# Patient Record
Sex: Male | Born: 1985
Health system: Southern US, Community
[De-identification: ages and names within clinical notes are randomized; demographics above are authoritative.]

## PROBLEM LIST (undated history)

## (undated) DIAGNOSIS — F419 Anxiety disorder, unspecified: Secondary | ICD-10-CM

## (undated) DIAGNOSIS — R002 Palpitations: Secondary | ICD-10-CM

## (undated) DIAGNOSIS — K219 Gastro-esophageal reflux disease without esophagitis: Secondary | ICD-10-CM

## (undated) DIAGNOSIS — R079 Chest pain, unspecified: Secondary | ICD-10-CM

## (undated) DIAGNOSIS — F410 Panic disorder [episodic paroxysmal anxiety] without agoraphobia: Secondary | ICD-10-CM

## (undated) HISTORY — DX: Palpitations: R00.2

## (undated) HISTORY — DX: Chest pain, unspecified: R07.9

## (undated) HISTORY — PX: KNEE SURGERY: SHX244

---

## 2018-06-03 ENCOUNTER — Encounter (HOSPITAL_BASED_OUTPATIENT_CLINIC_OR_DEPARTMENT_OTHER): Payer: Self-pay

## 2018-06-03 ENCOUNTER — Other Ambulatory Visit: Payer: Self-pay

## 2018-06-03 ENCOUNTER — Emergency Department (HOSPITAL_BASED_OUTPATIENT_CLINIC_OR_DEPARTMENT_OTHER)
Admission: EM | Admit: 2018-06-03 | Discharge: 2018-06-03 | Disposition: A | Discharge status: Home / Self Care | Payer: Self-pay | Attending: Emergency Medicine | Admitting: Emergency Medicine

## 2018-06-03 DIAGNOSIS — F419 Anxiety disorder, unspecified: Secondary | ICD-10-CM | POA: Insufficient documentation

## 2018-06-03 HISTORY — DX: Anxiety disorder, unspecified: F41.9

## 2018-06-03 HISTORY — DX: Gastro-esophageal reflux disease without esophagitis: K21.9

## 2018-06-03 HISTORY — DX: Panic disorder (episodic paroxysmal anxiety): F41.0

## 2018-06-03 MED ORDER — LORAZEPAM 1 MG PO TABS
1.0000 mg | ORAL_TABLET | Freq: Once | ORAL | Status: AC
  Administered 2018-06-03: 1 mg via ORAL
  Filled 2018-06-03: qty 1

## 2018-06-03 NOTE — ED Notes (Signed)
Pt on monitor 
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
## 2018-06-03 NOTE — ED Triage Notes (Signed)
C/o CP x 15 min-EDP at [facility]-pt NAD-resting on stretcher

## 2018-06-22 ENCOUNTER — Emergency Department (HOSPITAL_BASED_OUTPATIENT_CLINIC_OR_DEPARTMENT_OTHER): Payer: Self-pay

## 2018-06-22 ENCOUNTER — Encounter (HOSPITAL_BASED_OUTPATIENT_CLINIC_OR_DEPARTMENT_OTHER): Payer: Self-pay

## 2018-06-22 ENCOUNTER — Emergency Department (HOSPITAL_BASED_OUTPATIENT_CLINIC_OR_DEPARTMENT_OTHER)
Admission: EM | Admit: 2018-06-22 | Discharge: 2018-06-22 | Disposition: A | Discharge status: Home / Self Care | Payer: Self-pay | Attending: Emergency Medicine | Admitting: Emergency Medicine

## 2018-06-22 ENCOUNTER — Other Ambulatory Visit: Payer: Self-pay

## 2018-06-22 DIAGNOSIS — J189 Pneumonia, unspecified organism: Secondary | ICD-10-CM | POA: Insufficient documentation

## 2018-06-22 DIAGNOSIS — R059 Cough, unspecified: Secondary | ICD-10-CM

## 2018-06-22 DIAGNOSIS — R05 Cough: Secondary | ICD-10-CM

## 2018-06-22 DIAGNOSIS — F419 Anxiety disorder, unspecified: Secondary | ICD-10-CM | POA: Insufficient documentation

## 2018-06-22 LAB — GROUP A STREP BY PCR: GROUP A STREP BY PCR: NOT DETECTED

## 2018-06-22 MED ORDER — DEXAMETHASONE 4 MG PO TABS
8.0000 mg | ORAL_TABLET | Freq: Once | ORAL | Status: AC
  Administered 2018-06-22: 8 mg via ORAL
  Filled 2018-06-22: qty 2

## 2018-06-22 MED ORDER — AZITHROMYCIN 250 MG PO TABS: 250.0000 mg | ORAL_TABLET | Freq: Every day | ORAL | 0 refills | Status: DC

## 2018-06-22 NOTE — Discharge Instructions (Addendum)
You were evaluated in the Emergency Department and after careful evaluation, we did not find any emergent condition requiring admission or further testing in the hospital.  Your symptoms today seem to be due to pneumonia.  Please take the antibiotics provided as directed.  Please return to the Emergency Department if you experience any worsening of your condition.  We encourage you to follow up with a primary care provider.  Thank you for allowing us to be a part of your care.

## 2018-06-22 NOTE — ED Provider Notes (Signed)
[facility] Emergency Department Provider Note MRN:  [id number]  Arrival date & time: 06/22/18     Chief Complaint   Sore throat History of Present Illness   Jesse Mckinney is a 32 y.o. year-old male with a history of GERD presenting to the ED with chief complaint of sore throat.  Patient has been experiencing persistent, constant sore throat for 3 weeks.  Today, during an episode of coughing, and the sore throat became much worse, associated with a popping sensation in the throat.  Denies headache or vision change, no shortness of breath, no chest pain.  Denies recent fever, no runny nose, no nasal congestion, no cold-like symptoms.  No sick contacts.  Review of Systems  A complete 10 system review of systems was obtained and all systems are negative except as noted in the HPI and PMH.   Patient's Health History    Past Medical History:  Diagnosis Date  . Anxiety   . GERD (gastroesophageal reflux disease)   . Panic attack     Past Surgical History:  Procedure Laterality Date  . KNEE SURGERY      No family history on file.  Social History   Socioeconomic History  . Marital status: Married    Spouse name: Not on file  . Number of children: Not on file  . Years of education: Not on file  . Highest education level: Not on file  Occupational History  . Not on file  Social Needs  . Financial resource strain: Not on file  . Food insecurity:    Worry: Not on file    Inability: Not on file  . Transportation needs:    Medical: Not on file    Non-medical: Not on file  Tobacco Use  . Smoking status: Never Smoker  . Smokeless tobacco: Never Used  Substance and Sexual Activity  . Alcohol use: Yes    Comment: occ  . Drug use: Never  . Sexual activity: Not on file  Lifestyle  . Physical activity:    Days per week: Not on file    Minutes per session: Not on file  . Stress: Not on file  Relationships  . Social connections:    Talks on phone:  Not on file    Gets together: Not on file    Attends religious service: Not on file    Active member of club or organization: Not on file    Attends meetings of clubs or organizations: Not on file    Relationship status: Not on file  . Intimate partner violence:    Fear of current or ex partner: Not on file    Emotionally abused: Not on file    Physically abused: Not on file    Forced sexual activity: Not on file  Other Topics Concern  . Not on file  Social History Narrative  . Not on file     Physical Exam  Vital Signs and Nursing Notes reviewed Vitals:   06/22/18 1807 06/22/18 2023  BP: (!) 146/82 129/80  Pulse: 95 83  Resp: 18 18  Temp: 99.6 F (37.6 C)   SpO2: 99% 98%    CONSTITUTIONAL: Well-appearing, NAD NEURO:  Alert and oriented x 3, no focal deficits EYES:  eyes equal and reactive ENT/NECK:  no LAD, no JVD; mild erythema to the posterior oropharynx CARDIO: Regular rate, well-perfused, normal S1 and S2 PULM:  CTAB no wheezing or rhonchi GI/GU:  normal bowel sounds, non-distended, non-tender MSK/SPINE:  No gross deformities, no edema SKIN:  no rash, atraumatic PSYCH:  Appropriate speech and behavior  Diagnostic and Interventional Summary    Labs Reviewed  GROUP A STREP BY PCR    DG Neck Soft Tissue  Final Result    DG Chest 2 View  Final Result      Medications  dexamethasone (DECADRON) tablet 8 mg (8 mg Oral Given 06/22/18 2029)     Procedures Critical Care  ED Course and Medical Decision Making  I have reviewed the triage vital signs and the nursing notes.  Pertinent labs & imaging results that were available during my care of the patient were reviewed by me and considered in my medical decision making (see below for details).  3 weeks of sore throat, no lymphadenopathy, no asymmetry to suggest peritonsillar abscess, no trismus or voice change to suggest retropharyngeal abscess.  Will swab for strep throat, given the question of popping  sensation will obtain screening x-rays to exclude pneumothorax or pneumomediastinum.  X-ray of the neck unremarkable.  Chest x-ray reveals likely atypical pneumonia, which makes sense considering patient's 3 weeks of sore throat/cough.  Prescription for Z-Pak, will follow-up with PCP.  After the discussed management above, the patient was determined to be safe for discharge.  The patient was in agreement with this plan and all questions regarding their care were answered.  ED return precautions were discussed and the patient will return to the ED with any significant worsening of condition.  Elmer SowMichael M. Pilar PlateBero, MD [facility] [email or web address]  Final Clinical Impressions(s) / ED Diagnoses     ICD-10-CM   1. Atypical pneumonia J18.9   2. Cough R05 DG Chest 2 View    DG Chest 2 View    ED Discharge Orders         Ordered    azithromycin (ZITHROMAX) 250 MG tablet  Daily     06/22/18 2134             Sabas SousBero, Gustin Zobrist M, MD 06/22/18 2136

## 2018-06-22 NOTE — ED Triage Notes (Signed)
Pt states he had a forceful cough 3-4 weeks and felt a pop in throat that has cont'd to be painful-NAD-steady gait

## 2018-07-20 ENCOUNTER — Emergency Department (HOSPITAL_BASED_OUTPATIENT_CLINIC_OR_DEPARTMENT_OTHER): Payer: Self-pay

## 2018-07-20 ENCOUNTER — Emergency Department (HOSPITAL_BASED_OUTPATIENT_CLINIC_OR_DEPARTMENT_OTHER)
Admission: EM | Admit: 2018-07-20 | Discharge: 2018-07-20 | Disposition: A | Discharge status: Home / Self Care | Payer: Self-pay | Attending: Emergency Medicine | Admitting: Emergency Medicine

## 2018-07-20 ENCOUNTER — Other Ambulatory Visit: Payer: Self-pay

## 2018-07-20 ENCOUNTER — Encounter (HOSPITAL_BASED_OUTPATIENT_CLINIC_OR_DEPARTMENT_OTHER): Payer: Self-pay

## 2018-07-20 DIAGNOSIS — K2100 Gastro-esophageal reflux disease with esophagitis, without bleeding: Secondary | ICD-10-CM

## 2018-07-20 DIAGNOSIS — J40 Bronchitis, not specified as acute or chronic: Secondary | ICD-10-CM | POA: Insufficient documentation

## 2018-07-20 LAB — CBC
HCT: 47.7 % (ref 39.0–52.0)
HEMOGLOBIN: 14.3 g/dL (ref 13.0–17.0)
MCH: 25.9 pg — AB (ref 26.0–34.0)
MCHC: 30 g/dL (ref 30.0–36.0)
MCV: 86.4 fL (ref 80.0–100.0)
NRBC: 0 % (ref 0.0–0.2)
Platelets: 221 10*3/uL (ref 150–400)
RBC: 5.52 MIL/uL (ref 4.22–5.81)
RDW: 13.7 % (ref 11.5–15.5)
WBC: 8.9 10*3/uL (ref 4.0–10.5)

## 2018-07-20 LAB — D-DIMER, QUANTITATIVE (NOT AT ARMC): D DIMER QUANT: 0.28 ug{FEU}/mL (ref 0.00–0.50)

## 2018-07-20 LAB — BASIC METABOLIC PANEL
ANION GAP: 7 (ref 5–15)
BUN: 11 mg/dL (ref 6–20)
CO2: 24 mmol/L (ref 22–32)
Calcium: 9.2 mg/dL (ref 8.9–10.3)
Chloride: 108 mmol/L (ref 98–111)
Creatinine, Ser: 0.89 mg/dL (ref 0.61–1.24)
GFR calc Af Amer: >60 mL/min (ref >60)
GLUCOSE: 118 mg/dL — AB (ref 70–99)
POTASSIUM: 3.1 mmol/L — AB (ref 3.5–5.1)
Sodium: 139 mmol/L (ref 135–145)

## 2018-07-20 LAB — HEPATIC FUNCTION PANEL
ALBUMIN: 4.2 g/dL (ref 3.5–5.0)
ALK PHOS: 70 U/L (ref 38–126)
ALT: 18 U/L (ref 0–44)
AST: 21 U/L (ref 15–41)
Bilirubin, Direct: 0.1 mg/dL (ref 0.0–0.2)
Indirect Bilirubin: 0.8 mg/dL (ref 0.3–0.9)
TOTAL PROTEIN: 7.8 g/dL (ref 6.5–8.1)
Total Bilirubin: 0.9 mg/dL (ref 0.3–1.2)

## 2018-07-20 LAB — LIPASE, BLOOD: LIPASE: 32 U/L (ref 11–51)

## 2018-07-20 LAB — BRAIN NATRIURETIC PEPTIDE: B NATRIURETIC PEPTIDE 5: 28.7 pg/mL (ref 0.0–100.0)

## 2018-07-20 LAB — TROPONIN I

## 2018-07-20 MED ORDER — LIDOCAINE VISCOUS HCL 2 % MT SOLN
15.0000 mL | Freq: Once | OROMUCOSAL | Status: AC
  Administered 2018-07-20: 15 mL via ORAL
  Filled 2018-07-20: qty 15

## 2018-07-20 MED ORDER — ALUM & MAG HYDROXIDE-SIMETH 200-200-20 MG/5ML PO SUSP
30.0000 mL | Freq: Once | ORAL | Status: AC
  Administered 2018-07-20: 30 mL via ORAL
  Filled 2018-07-20: qty 30

## 2018-07-20 MED ORDER — OMEPRAZOLE 20 MG PO CPDR: 20.0000 mg | DELAYED_RELEASE_CAPSULE | Freq: Every day | ORAL | 1 refills | Status: DC

## 2018-07-20 NOTE — ED Notes (Signed)
Called out name is Lobby. Patient did not respond.

## 2018-07-20 NOTE — ED Provider Notes (Signed)
[facility] EMERGENCY DEPARTMENT Provider Note   CSN: [id number] Arrival date & time: 07/20/18  1315     History   Chief Complaint Chief Complaint  Patient presents with  . Shortness of Breath    HPI Jesse Mckinney is a 32 y.o. male.  Patient is a 32 year old obese male with a history of anxiety and GERD presenting today with ongoing issues after being diagnosed with pneumonia approximately 3 weeks ago.  Patient states that his biggest issue is chest discomfort approximately 20 minutes after eating no matter when he eats and also if he eats too soon before going to bed he also has the symptoms of chest tightness.  He states that his stomach will feel very bloated and he will feel nauseated but never vomits or has diarrhea.  He states similar things were going on 3 weeks ago and that is when they told him he had pneumonia.  He completed a course of azithromycin and then took a 10-day course of another antibiotic that he is not sure of.  He is completed the antibiotics and states he will have an occasional cough but he has no production in his cough.  He felt warm this morning but has not had ongoing fevers.  He does not get chest tightness with walking but will occasionally still feel slightly more winded which he attributed to the recent pneumonia.  The symptoms always occur after eating.  He has not had any leg swelling or weight gain.  He actually states that he is lost a significant amount of weight by diet and exercise and continues to do so.  He does not drink alcohol or use illicit drugs.  Today he started having a coughing fit and states it felt like he could not breathe which caused him to go into a panic attack.  He he vomited 2 times and called 911.  When they arrived they noted a temperature of 100.6 and gave him thousand milligrams of Tylenol and an albuterol neb.  Patient states that that improved but he will still occasionally get palpitations.  Currently is taking no  medications.  The history is provided by the patient.  Shortness of Breath  This is a recurrent problem. The average episode lasts 3 weeks. The problem occurs intermittently.Associated symptoms include a fever. It is unknown what precipitated the problem. Treatments tried: abx. The treatment provided no relief. He has had no prior hospitalizations. He has had prior ED visits. He has had no prior ICU admissions. Associated medical issues include pneumonia. Associated medical issues do not include asthma, PE, CAD, heart failure, DVT or recent surgery.    Past Medical History:  Diagnosis Date  . Anxiety   . GERD (gastroesophageal reflux disease)   . Panic attack     There are no active problems to display for this patient.   Past Surgical History:  Procedure Laterality Date  . KNEE SURGERY          Home Medications    Prior to Admission medications   Medication Sig Start Date End Date Taking? Authorizing Provider  azithromycin (ZITHROMAX) 250 MG tablet Take 1 tablet (250 mg total) by mouth daily. Take first 2 tablets together, then 1 every day until finished. 06/22/18   Sabas SousBero, Michael M, MD    Family History No family history on file.  Social History Social History   Tobacco Use  . Smoking status: Never Smoker  . Smokeless tobacco: Never Used  Substance Use Topics  .  Alcohol use: Yes    Comment: occ  . Drug use: Never     Allergies   Iodinated diagnostic agents; Iodine; and Mushroom extract complex   Review of Systems Review of Systems  Constitutional: Positive for fever.  Respiratory: Positive for shortness of breath.   All other systems reviewed and are negative.    Physical Exam Updated Vital Signs BP 124/70   Pulse 74   Temp 99.3 F (37.4 C) (Oral)   Resp (!) 25   Ht 6\' 6"  (1.981 m)   Wt (!) 178.3 kg   SpO2 94%   BMI 45.42 kg/m   Physical Exam Vitals signs and nursing note reviewed.  Constitutional:      General: He is not in acute  distress.    Appearance: He is well-developed. He is obese.  HENT:     Head: Normocephalic and atraumatic.  Eyes:     Conjunctiva/sclera: Conjunctivae normal.     Pupils: Pupils are equal, round, and reactive to light.  Neck:     Musculoskeletal: Normal range of motion and neck supple.  Cardiovascular:     Rate and Rhythm: Normal rate and regular rhythm.     Heart sounds: No murmur.  Pulmonary:     Effort: Pulmonary effort is normal. No respiratory distress.     Breath sounds: Decreased breath sounds present. No wheezing or rales.  Abdominal:     General: There is no distension.     Palpations: Abdomen is soft.     Tenderness: There is no abdominal tenderness. There is no guarding or rebound.  Musculoskeletal: Normal range of motion.        General: No tenderness.     Right lower leg: He exhibits no tenderness. No edema.     Left lower leg: He exhibits no tenderness. No edema.  Skin:    General: Skin is warm and dry.     Capillary Refill: Capillary refill takes less than 2 seconds.     Findings: No erythema or rash.  Neurological:     Mental Status: He is alert and oriented to person, place, and time.  Psychiatric:        Behavior: Behavior normal.      ED Treatments / Results  Labs (all labs ordered are listed, but only abnormal results are displayed) Labs Reviewed  BASIC METABOLIC PANEL - Abnormal; Notable for the following components:      Result Value   Potassium 3.1 (*)    Glucose, Bld 118 (*)    All other components within normal limits  CBC - Abnormal; Notable for the following components:   MCH 25.9 (*)    All other components within normal limits  TROPONIN I  D-DIMER, QUANTITATIVE (NOT AT [facility])  BRAIN NATRIURETIC PEPTIDE  HEPATIC FUNCTION PANEL  LIPASE, BLOOD    EKG EKG Interpretation  Date/Time:  Monday July 20 2018 13:22:54 EST Ventricular Rate:  106 PR Interval:  144 QRS Duration: 94 QT Interval:  330 QTC Calculation: 438 R  Axis:   66 Text Interpretation:  Sinus tachycardia Possible Inferior infarct , age undetermined T wave abnormality, consider lateral ischemia No significant change since last tracing Confirmed by Gwyneth SproutPlunkett, Tennyson Kallen ([id number]) on 07/20/2018 3:12:38 PM   Radiology Dg Chest 2 View  Result Date: 07/20/2018 CLINICAL DATA:  Initial evaluation for acute shortness of breath, cough. EXAM: CHEST - 2 VIEW COMPARISON:  Prior radiograph from 07/10/2018 FINDINGS: Cardiac and mediastinal silhouettes stable in size and contour, and remain within  normal limits. Lungs well inflated bilaterally. Mild diffuse peribronchial thickening. No focal infiltrates, edema, or effusion. No pneumothorax. No acute osseous abnormality. IMPRESSION: Mild diffuse peribronchial thickening, suggesting possible bronchiolitis given provided history of cough and shortness of breath. No focal infiltrates to suggest pneumonia. Electronically Signed   By: Rise Mu M.D.   On: 07/20/2018 13:45    Procedures Procedures (including critical care time)  Medications Ordered in ED Medications  alum & mag hydroxide-simeth (MAALOX/MYLANTA) 200-200-20 MG/5ML suspension 30 mL (30 mLs Oral Given 07/20/18 1525)    And  lidocaine (XYLOCAINE) 2 % viscous mouth solution 15 mL (15 mLs Oral Given 07/20/18 1526)     Initial Impression / Assessment and Plan / ED Course  I have reviewed the triage vital signs and the nursing notes.  Pertinent labs & imaging results that were available during my care of the patient were reviewed by me and considered in my medical decision making (see chart for details).    Patient presenting today with multiple complaints.  Initially seems more like acid reflux with abdominal discomfort and bloating after eating.  This is in the setting of recently having multiple courses of antibiotics for pneumonia.  Patient is also having intermittent palpitations, panic attacks and occasional dyspnea on exertion that has not  really worsened but persists.  He does not smoke cigarettes he does not have productive cough but was noted by EMS to have a temperature of 100.6 today.  On exam patient is well-appearing with normal vital signs.  Low risk Wells with a negative d-dimer.  Chest x-ray showing possible bronchiolitis but no wheezing today.  Patient has no evidence of CHF, renal insufficiency and a normal troponin and EKG.  The chest discomfort he describes is more epigastric and lower sternal.  It is always worse with eating and with laying down if he eats soon before he lays down.  Findings discussed with the patient.  Will have him try a course of PPIs and symptoms were improved with GI cocktail.  Also patient given an inhaler to use when he feels short of breath and stressed the importance of following up with the PCP.   Final Clinical Impressions(s) / ED Diagnoses   Final diagnoses:  Gastroesophageal reflux disease with esophagitis  Bronchitis    ED Discharge Orders         Ordered    omeprazole (PRILOSEC) 20 MG capsule  Daily     07/20/18 1701           Gwyneth Sprout, MD 07/20/18 1807

## 2018-07-20 NOTE — Discharge Instructions (Signed)
Use the inhaler as needed if you have shortness of breath and cough.  Take the medicine for the reflux and try to avoid Alka-Seltzer, aspirin, ibuprofen products and foods that irritate your stomach.  If you develop the palpitations again and they are not going away return so you can get an EKG to ensure you are in a normal rhythm.

## 2018-07-20 NOTE — ED Notes (Signed)
ED Provider at bedside. 

## 2018-07-20 NOTE — ED Triage Notes (Signed)
Pt arrived via [facility] Pt had sudden onset of ShOB and coughing. Pt has a hx of pneumonia x1 month ago which has since resolved. EMS report pt had diminished lung sounds and a temp of 100.6. EMS admin 5mg  of albuterol and 1,000 mg of APAP. EMS did report that during transport pt had a 30 second episode of chest tightness and HR increased to 140 which quickly returned to baseline. Pt states he is feeling anxious.

## 2018-07-20 NOTE — ED Notes (Signed)
Pt had been called multiple times for a room with no response. Pt now returned stated he had to pick up his car and [employer] account plannerphone charger. IV still in place.

## 2018-09-29 ENCOUNTER — Encounter (HOSPITAL_BASED_OUTPATIENT_CLINIC_OR_DEPARTMENT_OTHER): Payer: Self-pay | Admitting: *Deleted

## 2018-09-29 ENCOUNTER — Other Ambulatory Visit: Payer: Self-pay

## 2018-09-29 ENCOUNTER — Emergency Department (HOSPITAL_BASED_OUTPATIENT_CLINIC_OR_DEPARTMENT_OTHER): Payer: Self-pay

## 2018-09-29 ENCOUNTER — Emergency Department (HOSPITAL_BASED_OUTPATIENT_CLINIC_OR_DEPARTMENT_OTHER)
Admission: EM | Admit: 2018-09-29 | Discharge: 2018-09-29 | Disposition: A | Discharge status: Home / Self Care | Payer: Self-pay | Attending: Emergency Medicine | Admitting: Emergency Medicine

## 2018-09-29 DIAGNOSIS — R0602 Shortness of breath: Secondary | ICD-10-CM

## 2018-09-29 DIAGNOSIS — F419 Anxiety disorder, unspecified: Secondary | ICD-10-CM | POA: Insufficient documentation

## 2018-09-29 DIAGNOSIS — J069 Acute upper respiratory infection, unspecified: Secondary | ICD-10-CM | POA: Insufficient documentation

## 2018-09-29 LAB — CBC WITH DIFFERENTIAL/PLATELET
Abs Immature Granulocytes: 0.02 10*3/uL (ref 0.00–0.07)
BASOS PCT: 0 %
Basophils Absolute: 0 10*3/uL (ref 0.0–0.1)
Eosinophils Absolute: 0.1 10*3/uL (ref 0.0–0.5)
Eosinophils Relative: 1 %
HCT: 39.7 % (ref 39.0–52.0)
Hemoglobin: 12 g/dL — ABNORMAL LOW (ref 13.0–17.0)
Immature Granulocytes: 0 %
Lymphocytes Relative: 24 %
Lymphs Abs: 1.9 10*3/uL (ref 0.7–4.0)
MCH: 25.8 pg — ABNORMAL LOW (ref 26.0–34.0)
MCHC: 30.2 g/dL (ref 30.0–36.0)
MCV: 85.4 fL (ref 80.0–100.0)
Monocytes Absolute: 0.8 10*3/uL (ref 0.1–1.0)
Monocytes Relative: 10 %
Neutro Abs: 5.1 10*3/uL (ref 1.7–7.7)
Neutrophils Relative %: 65 %
Platelets: 145 10*3/uL — ABNORMAL LOW (ref 150–400)
RBC: 4.65 MIL/uL (ref 4.22–5.81)
RDW: 14.7 % (ref 11.5–15.5)
Smear Review: NORMAL
WBC: 7.9 10*3/uL (ref 4.0–10.5)
nRBC: 0 % (ref 0.0–0.2)

## 2018-09-29 LAB — COMPREHENSIVE METABOLIC PANEL
ALT: 15 U/L (ref 0–44)
AST: 17 U/L (ref 15–41)
Albumin: 4 g/dL (ref 3.5–5.0)
Alkaline Phosphatase: 68 U/L (ref 38–126)
Anion gap: 6 (ref 5–15)
BILIRUBIN TOTAL: 0.8 mg/dL (ref 0.3–1.2)
BUN: 18 mg/dL (ref 6–20)
CO2: 24 mmol/L (ref 22–32)
Calcium: 8.7 mg/dL — ABNORMAL LOW (ref 8.9–10.3)
Chloride: 107 mmol/L (ref 98–111)
Creatinine, Ser: 0.81 mg/dL (ref 0.61–1.24)
GFR calc Af Amer: >60 mL/min (ref >60)
GFR calc non Af Amer: >60 mL/min (ref >60)
GLUCOSE: 97 mg/dL (ref 70–99)
Potassium: 3.5 mmol/L (ref 3.5–5.1)
Sodium: 137 mmol/L (ref 135–145)
Total Protein: 7.3 g/dL (ref 6.5–8.1)

## 2018-09-29 LAB — TROPONIN I: Troponin I: <0.03 ng/mL (ref <0.03)

## 2018-09-29 MED ORDER — LORAZEPAM 1 MG PO TABS: 1.0000 mg | ORAL_TABLET | Freq: Three times a day (TID) | ORAL | 0 refills | Status: DC | PRN

## 2018-09-29 NOTE — ED Triage Notes (Signed)
Pt reports he got up tonight and he felt very short of breath. No pain. Feels better now, just says he "does not feel right" unable to explain exactly what that means to him. No acute distress. Reports he has had more panic attacks lately, no meds for the same. Pt also is concerned for about 6-7 months he has not been sleeping well.

## 2018-09-29 NOTE — ED Provider Notes (Signed)
[facility] EMERGENCY DEPARTMENT Provider Note   CSN: [id number] Arrival date & time: 09/29/18  [id number]    History   Chief Complaint Chief Complaint  Patient presents with  . Shortness of Breath    HPI Jesse Mckinney is a 33 y.o. male.     The history is provided by the patient and medical records. No language interpreter was used.  Shortness of Breath  Severity:  Moderate Onset quality:  Gradual Duration:  3 days Timing:  Intermittent Progression:  Waxing and waning Chronicity:  Recurrent Context: URI   Relieved by:  Nothing Worsened by:  Nothing Ineffective treatments:  None tried Associated symptoms: cough and sputum production   Associated symptoms: no abdominal pain, no chest pain, no diaphoresis, no fever, no headaches, no hemoptysis, no neck pain, no rash, no sore throat, no vomiting and no wheezing   Risk factors: obesity   Risk factors: no hx of PE/DVT     Past Medical History:  Diagnosis Date  . Anxiety   . GERD (gastroesophageal reflux disease)   . Panic attack     There are no active problems to display for this patient.   Past Surgical History:  Procedure Laterality Date  . KNEE SURGERY          Home Medications    Prior to Admission medications   Medication Sig Start Date End Date Taking? Authorizing Provider  azithromycin (ZITHROMAX) 250 MG tablet Take 1 tablet (250 mg total) by mouth daily. Take first 2 tablets together, then 1 every day until finished. 06/22/18   Sabas SousBero, Michael M, MD  omeprazole (PRILOSEC) 20 MG capsule Take 1 capsule (20 mg total) by mouth daily. 07/20/18   Gwyneth SproutPlunkett, Whitney, MD    Family History No family history on file.  Social History Social History   Tobacco Use  . Smoking status: Never Smoker  . Smokeless tobacco: Never Used  Substance Use Topics  . Alcohol use: Yes    Comment: occ  . Drug use: Never     Allergies   Iodinated diagnostic agents; Iodine; and Mushroom extract  complex   Review of Systems Review of Systems  Constitutional: Positive for fatigue. Negative for chills, diaphoresis and fever.  HENT: Positive for congestion. Negative for sore throat.   Eyes: Negative for visual disturbance.  Respiratory: Positive for cough, sputum production, chest tightness and shortness of breath. Negative for hemoptysis, wheezing and stridor.   Cardiovascular: Negative for chest pain, palpitations and leg swelling.  Gastrointestinal: Negative for abdominal pain, constipation, diarrhea, nausea and vomiting.  Genitourinary: Negative for flank pain.  Musculoskeletal: Negative for back pain, neck pain and neck stiffness.  Skin: Negative for rash and wound.  Neurological: Negative for light-headedness and headaches.  Psychiatric/Behavioral: Negative for agitation.  All other systems reviewed and are negative.    Physical Exam Updated Vital Signs BP 124/71 (BP Location: Right Arm)   Pulse 66   Temp 97.9 F (36.6 C) (Oral)   Resp 18   SpO2 98%   Physical Exam Vitals signs and nursing note reviewed.  Constitutional:      General: He is not in acute distress.    Appearance: He is well-developed. He is obese. He is not ill-appearing, toxic-appearing or diaphoretic.  HENT:     Head: Normocephalic and atraumatic.     Mouth/Throat:     Pharynx: No pharyngeal swelling or oropharyngeal exudate.  Eyes:     Extraocular Movements: Extraocular movements intact.     Conjunctiva/sclera: Conjunctivae  normal.     Pupils: Pupils are equal, round, and reactive to light.  Neck:     Musculoskeletal: Normal range of motion and neck supple.  Cardiovascular:     Rate and Rhythm: Normal rate and regular rhythm.     Heart sounds: No murmur.  Pulmonary:     Effort: Pulmonary effort is normal. No tachypnea or respiratory distress.     Breath sounds: Normal breath sounds. No stridor. No decreased breath sounds, wheezing, rhonchi or rales.  Chest:     Chest wall: No tenderness.   Abdominal:     Palpations: Abdomen is soft.     Tenderness: There is no abdominal tenderness.  Musculoskeletal:     Right lower leg: He exhibits no tenderness. No edema.     Left lower leg: He exhibits no tenderness. No edema.  Skin:    General: Skin is warm and dry.     Capillary Refill: Capillary refill takes less than 2 seconds.     Findings: No rash.  Neurological:     General: No focal deficit present.     Mental Status: He is alert.  Psychiatric:        Mood and Affect: Mood normal.      ED Treatments / Results  Labs (all labs ordered are listed, but only abnormal results are displayed) Labs Reviewed  CBC WITH DIFFERENTIAL/PLATELET - Abnormal; Notable for the following components:      Result Value   Hemoglobin 12.0 (*)    MCH 25.8 (*)    Platelets 145 (*)    All other components within normal limits  COMPREHENSIVE METABOLIC PANEL - Abnormal; Notable for the following components:   Calcium 8.7 (*)    All other components within normal limits  TROPONIN I    EKG EKG Interpretation  Date/Time:  Tuesday September 29 2018 06:40:25 EST Ventricular Rate:  70 PR Interval:    QRS Duration: 85 QT Interval:  373 QTC Calculation: 403 R Axis:   74 Text Interpretation:  Sinus rhythm Low voltage, precordial leads flipped t wave in inferior leads resolved Otherwise no significant change Confirmed by Melene Plan [phone]) on 09/29/2018 6:46:08 AM Also confirmed by Melene Plan [phone]), editor Barbette Hair [phone])  on 09/29/2018 7:14:02 AM   Radiology Dg Chest 2 View  Result Date: 09/29/2018 CLINICAL DATA:  Initial evaluation for acute shortness of breath. EXAM: CHEST - 2 VIEW COMPARISON:  Prior radiograph from 07/20/2018. FINDINGS: The cardiac and mediastinal silhouettes are stable in size and contour, and remain within normal limits. The lungs are normally inflated. Mild diffuse peribronchial thickening. No airspace consolidation, pleural effusion, or pulmonary edema is identified.  There is no pneumothorax. No acute osseous abnormality identified. IMPRESSION: 1. Mild diffuse peribronchial thickening, nonspecific, but can be seen in setting of reactive airways disease and/or bronchiolitis. No consolidative opacity to suggest pneumonia. 2. No other active cardiopulmonary disease. Electronically Signed   By: Rise Mu M.D.   On: 09/29/2018 07:03    Procedures Procedures (including critical care time)  Medications Ordered in ED Medications - No data to display   Initial Impression / Assessment and Plan / ED Course  I have reviewed the triage vital signs and the nursing notes.  Pertinent labs & imaging results that were available during my care of the patient were reviewed by me and considered in my medical decision making (see chart for details).        Amado Andal is a 33 y.o. male with a  past medical history significant for anxiety, panic attacks, and GERD who presents with several days of congestion, cough, chills, shortness of breath, mild chest tightness.  Patient reports that this morning he woke up and was having difficulty breathing prompting him to call 911 for evaluation.  Patient thinks it may be related to anxiety and stress however he does think that he has had upper respiratory symptoms.  He reports that his wife had recent flulike illness and has had the congestion and slightly productive cough for the last several days.  He denies any chest pain at this time.  Denies any palpitations.  No leg pain or leg swelling.  No history of DVT or PE.  Patient reports no abdominal pain, nausea, vomiting, diaphoresis, urinary symptoms or GI symptoms.  No recent trauma.  On exam, patient's lungs are clear.  No wheezing rhonchi or crackles.  No tenderness in the chest or abdomen.  Normal pulses in extremities.  Minimal edema in the legs.  Normal strength in the legs.  Patient resting comfortably and he has reassuring vital signs with a normal heart rate, oxygen  saturations were 98 and 100% on room air, and he is not tachypneic.  Clinical aspect patient has a combination of viral URI worsening his breathing and cough as well as anxiety and panic attack.  EKG shows no STEMI today.  No significant arrhythmia.  Patient will have a single troponin as her symptoms of been ongoing for the last few days with chest tightness.  Low suspicion for cardiac etiology.  Patient will screen laboratory testing and chest x-ray to look for pneumonia.  If x-ray does not show pneumonia, and labs are reassuring and patient remains on room air, patient will likely be stable for discharge home with conservative recommendations for viral URI causing shortness of breath as well as recommendations to follow-up with PCP for further management of anxiety and stress.  Will consider prescription for short course of Ativan to help with panic attacks.  8:42 AM Laboratory testing was overall reassuring.  Chest x-ray showed no acute pneumonia but did show some symptoms of reactive airway disease or bronchiolitis.  Patient reassessed and still had no wheezing.  And off albuterol abuse beneficial at this time however patient will be treated with prescription for Ativan to help with his anxiety at home.  Patient will follow-up with PCP for outpatient management.  Patient understood return precautions and follow-up instructions.  Patient discharged in good condition with improved symptoms and reassuring vital signs.    Final Clinical Impressions(s) / ED Diagnoses   Final diagnoses:  Upper respiratory tract infection, unspecified type  SOB (shortness of breath)  Anxiety    ED Discharge Orders         Ordered    LORazepam (ATIVAN) 1 MG tablet  Every 8 hours PRN     09/29/18 0840          Clinical Impression: 1. Upper respiratory tract infection, unspecified type   2. SOB (shortness of breath)   3. Anxiety     Disposition: Discharge  Condition: Good  I have discussed the  results, Dx and Tx plan with the pt(& family if present). He/she/they expressed understanding and agree(s) with the plan. Discharge instructions discussed at great length. Strict return precautions discussed and pt &/or family have verbalized understanding of the instructions. No further questions at time of discharge.    New Prescriptions   LORAZEPAM (ATIVAN) 1 MG TABLET    Take 1 tablet (1  mg total) by mouth every 8 (eight) hours as needed for anxiety.    Follow Up: [facility] [address] [place] [address] [phone] Schedule an appointment as soon as possible for a visit    [facility] [address] [id number] [address] [employer] [place] [address] [phone]       Tegeler, Canary Brim, MD 09/29/18 [phone]

## 2018-09-29 NOTE — ED Notes (Signed)
ED Provider at bedside. 

## 2018-09-29 NOTE — ED Notes (Addendum)
ED Provider at bedside. Dr. Tegeler 

## 2018-09-29 NOTE — ED Triage Notes (Signed)
Pt arrives via EMS from home, with c/o sob tonight. En route vitals 136/80, hr 70, hx of anxiety, gerd. 12 lead WNL, pt had recent anxiety attack.

## 2018-09-29 NOTE — Discharge Instructions (Signed)
Your history and exam today are consistent with a viral upper respiratory infection causing your congestion and cough and shortness of breath symptoms.  I suspect that your anxiety and panic exacerbated the symptoms overnight.  Without wheezing, we did not feel that albuterol would be as beneficial at this time however we do think that treating her anxiety would help.  We also feel you need to follow-up with your primary doctor and as you discussed, may need outpatient work-up for sleep apnea.  Please follow-up with your primary care physician and use the anxiety medicine if you are having a panic attack.  Please follow-up with them for further ongoing management of your symptoms.  If any symptoms change or worsen, please return to the nearest emergency department.

## 2018-09-30 ENCOUNTER — Ambulatory Visit: Payer: Self-pay | Admitting: Family Medicine

## 2018-10-02 ENCOUNTER — Encounter (HOSPITAL_BASED_OUTPATIENT_CLINIC_OR_DEPARTMENT_OTHER): Payer: Self-pay | Admitting: Emergency Medicine

## 2018-10-02 ENCOUNTER — Emergency Department (HOSPITAL_BASED_OUTPATIENT_CLINIC_OR_DEPARTMENT_OTHER)
Admission: EM | Admit: 2018-10-02 | Discharge: 2018-10-02 | Disposition: A | Discharge status: Home / Self Care | Payer: Self-pay | Attending: Emergency Medicine | Admitting: Emergency Medicine

## 2018-10-02 ENCOUNTER — Other Ambulatory Visit: Payer: Self-pay

## 2018-10-02 DIAGNOSIS — Z79899 Other long term (current) drug therapy: Secondary | ICD-10-CM | POA: Insufficient documentation

## 2018-10-02 DIAGNOSIS — F410 Panic disorder [episodic paroxysmal anxiety] without agoraphobia: Secondary | ICD-10-CM | POA: Insufficient documentation

## 2018-10-02 MED ORDER — LORAZEPAM 1 MG PO TABS: 2.0000 mg | ORAL_TABLET | Freq: Once | ORAL | Status: DC

## 2018-10-02 MED ORDER — HYDROXYZINE HCL 25 MG PO TABS: 25.0000 mg | ORAL_TABLET | Freq: Four times a day (QID) | ORAL | 0 refills | Status: DC | PRN

## 2018-10-02 MED ORDER — LORAZEPAM 1 MG PO TABS
1.0000 mg | ORAL_TABLET | Freq: Once | ORAL | Status: AC
  Administered 2018-10-02: 1 mg via ORAL
  Filled 2018-10-02: qty 1

## 2018-10-02 NOTE — ED Provider Notes (Signed)
[facility] EMERGENCY DEPARTMENT Provider Note   CSN: [id number] Arrival date & time: 10/02/18  2209    History   Chief Complaint Chief Complaint  Patient presents with  . Anxiety    HPI Jesse Mckinney is a 33 y.o. male with past medical history of anxiety, GERD, presenting to the emergency department with complaint of panic attack that occurred while at work prior to arrival.  Patient states he began feeling a "weakness" in his chest and felt like he may be having a panic attack coming on.  He states he began to walk to his car, however began feeling worse.  He began feeling like his heart was racing a little bit shortness of breath.  He then called EMS.  No chest pain.  No nausea or vomiting.  No radiation of symptoms.  No interventions provided by EMS, however symptoms are nearly resolved.  He was provided with a prescription of Ativan from his last ED visit 3 days ago, however has not yet filled it.  He states he had a PCP appointment scheduled for yesterday, however was called into work and was unable to make it to the appointment.     The history is provided by the patient.    Past Medical History:  Diagnosis Date  . Anxiety   . GERD (gastroesophageal reflux disease)   . Panic attack     There are no active problems to display for this patient.   Past Surgical History:  Procedure Laterality Date  . KNEE SURGERY          Home Medications    Prior to Admission medications   Medication Sig Start Date End Date Taking? Authorizing Provider  azithromycin (ZITHROMAX) 250 MG tablet Take 1 tablet (250 mg total) by mouth daily. Take first 2 tablets together, then 1 every day until finished. 06/22/18   Sabas Sous, MD  LORazepam (ATIVAN) 1 MG tablet Take 1 tablet (1 mg total) by mouth every 8 (eight) hours as needed for anxiety. 09/29/18   Tegeler, Canary Brim, MD  omeprazole (PRILOSEC) 20 MG capsule Take 1 capsule (20 mg total) by mouth daily. 07/20/18    Gwyneth Sprout, MD    Family History No family history on file.  Social History Social History   Tobacco Use  . Smoking status: Never Smoker  . Smokeless tobacco: Never Used  Substance Use Topics  . Alcohol use: Yes    Comment: occ  . Drug use: Never     Allergies   Iodinated diagnostic agents; Iodine; and Mushroom extract complex   Review of Systems Review of Systems  Respiratory: Positive for chest tightness and shortness of breath.   Psychiatric/Behavioral: Negative for dysphoric mood and suicidal ideas. The patient is nervous/anxious.   All other systems reviewed and are negative.    Physical Exam Updated Vital Signs BP 134/80   Pulse 79   Temp 98.6 F (37 C) (Oral)   Resp 16   Ht 6\' 6"  (1.981 m)   Wt (!) 199.1 kg   SpO2 94%   BMI 50.73 kg/m   Physical Exam Vitals signs and nursing note reviewed.  Constitutional:      Appearance: He is well-developed.     Comments: Morbidly obese.  Appears slightly anxious, however is not in acute distress.  HENT:     Head: Normocephalic and atraumatic.  Eyes:     Extraocular Movements: Extraocular movements intact.     Conjunctiva/sclera: Conjunctivae normal.  Pupils: Pupils are equal, round, and reactive to light.  Cardiovascular:     Rate and Rhythm: Normal rate and regular rhythm.  Pulmonary:     Effort: Pulmonary effort is normal. No respiratory distress.     Breath sounds: Normal breath sounds.  Abdominal:     General: Bowel sounds are normal.     Palpations: Abdomen is soft.     Tenderness: There is no abdominal tenderness. There is no guarding.  Skin:    General: Skin is warm.  Neurological:     Mental Status: He is alert.  Psychiatric:        Behavior: Behavior normal.        Thought Content: Thought content does not include homicidal or suicidal ideation.     Comments: Patient is cooperative.  May be slightly anxious, however not in distress.      ED Treatments / Results  Labs (all labs  ordered are listed, but only abnormal results are displayed) Labs Reviewed - No data to display  EKG None  Radiology No results found.  Procedures Procedures (including critical care time)  Medications Ordered in ED Medications  LORazepam (ATIVAN) tablet 1 mg (1 mg Oral Given 10/02/18 2233)     Initial Impression / Assessment and Plan / ED Course  I have reviewed the triage vital signs and the nursing notes.  Pertinent labs & imaging results that were available during my care of the patient were reviewed by me and considered in my medical decision making (see chart for details).        Patient with history anxiety, presenting via EMS after panic attack that occurred while at work today.  Patient symptoms nearly resolved upon evaluation.  She states he had the fleeting feeling of anxiety and was unable to stand top of the symptoms before it took over.  He was given a prescription for Ativan by previous ED provider 3 days ago, however patient has not filled this prescription yet.  He has PCP that he can follow-up with.  States in the past Wellbutrin has helped his symptoms and feels like maybe he needs to return to this medication.  Treated in the ED with dose of Ativan.  Patient nearly at his baseline.  Will discharge return precautions.  Patient's wife is coming to pick him up.  Pt is agreeable to plan.  Discussed results, findings, treatment and follow up. Patient advised of return precautions. Patient verbalized understanding and agreed with plan.   Final Clinical Impressions(s) / ED Diagnoses   Final diagnoses:  Anxiety attack    ED Discharge Orders    None       Waverly Chavarria, [place] N, PA-C 10/02/18 2244    Gwyneth Sprout, MD 10/05/18 [phone]

## 2018-10-02 NOTE — Discharge Instructions (Addendum)
Fill the prescription prescribed to you during last ED visit.  You can take his medication as prescribed for anxiety.  Schedule an appointment with your primary care provider for close follow-up on your recent ED visits. Return to the emergency department if you began having thoughts or feelings of harming herself or ending your life.

## 2018-10-02 NOTE — ED Triage Notes (Signed)
Pt states he had a panic attack while at work tonight. Pt has prescription for ativan and plans on picking it up when he gets paid.

## 2018-11-24 ENCOUNTER — Other Ambulatory Visit: Payer: Self-pay

## 2018-11-24 ENCOUNTER — Encounter (HOSPITAL_COMMUNITY): Payer: Self-pay

## 2018-11-24 ENCOUNTER — Ambulatory Visit (INDEPENDENT_AMBULATORY_CARE_PROVIDER_SITE_OTHER): Payer: Self-pay

## 2018-11-24 ENCOUNTER — Ambulatory Visit (HOSPITAL_COMMUNITY)
Admission: EM | Admit: 2018-11-24 | Discharge: 2018-11-24 | Disposition: A | Discharge status: Home / Self Care | Payer: Self-pay | Attending: Family Medicine | Admitting: Family Medicine

## 2018-11-24 DIAGNOSIS — R0602 Shortness of breath: Secondary | ICD-10-CM

## 2018-11-24 DIAGNOSIS — F419 Anxiety disorder, unspecified: Secondary | ICD-10-CM

## 2018-11-24 MED ORDER — BUSPIRONE HCL 10 MG PO TABS: 10.0000 mg | ORAL_TABLET | Freq: Three times a day (TID) | ORAL | 0 refills | Status: DC

## 2018-11-24 MED ORDER — HYDROXYZINE HCL 25 MG PO TABS: 25.0000 mg | ORAL_TABLET | Freq: Four times a day (QID) | ORAL | 0 refills | Status: DC | PRN

## 2018-11-24 NOTE — ED Triage Notes (Addendum)
C/o chest pain and sob that began last night but states that has not resolved, pt states he thought it was just anxiety but symptoms usually resolve if so. Pt reports that he ran out of his medication 2 days ago

## 2018-11-24 NOTE — Discharge Instructions (Addendum)
Your physical examination is normal.  Lungs are clear and heart sounds normal. Your chest x-ray is normal. I believe your symptoms are from anxiety I have refilled your BuSpar for 30 days I have also refilled the hydroxyzine as needed for panic It is important that you establish with a new mental health provider to get refills

## 2018-11-24 NOTE — ED Provider Notes (Signed)
[facility]    CSN: [id number] Arrival date & time: 11/24/18  0901     History   Chief Complaint Chief Complaint  Patient presents with  . Chest Pain    HPI Jesse Mckinney is a 33 y.o. male.   HPI   Patient has a history of chronic anxiety with panic attacks.  He is not currently under physicians care for this.  He has been on multiple medications for this and has seen psychiatry in the past.  He has been most successful with BuSpar taken daily with occasional Ativan.  He states in spite of this when he went to the emergency room on 3/6/ 2020 they give a medicine that he thinks worked better than Ativan.  This medicine was hydroxyzine. Patient has been out of his medicines for 3 days. Patient states last night he developed some chest pain shortness of breath.  He recognizes his panic.  He had rapid heartbeats.  He said he did some deep breathing and was able to get to sleep.  When he woke up this morning he was still feeling a little short of breath. He has had no fever chills.  No cough.  No history of asthma or lung disease.  He had pneumonia in the past but this just feels different.  He no longer has chest pain or pressure.  He is here because of his wife's concern.  He is requesting a chest x-ray.  Past Medical History:  Diagnosis Date  . Anxiety   . GERD (gastroesophageal reflux disease)   . Panic attack     There are no active problems to display for this patient.   Past Surgical History:  Procedure Laterality Date  . KNEE SURGERY         Home Medications    Prior to Admission medications   Medication Sig Start Date End Date Taking? Authorizing Provider  LORazepam (ATIVAN) 1 MG tablet Take 1 tablet (1 mg total) by mouth every 8 (eight) hours as needed for anxiety. 09/29/18  Yes Tegeler, Canary Brim, MD  busPIRone (BUSPAR) 10 MG tablet Take 1 tablet (10 mg total) by mouth 3 (three) times daily. 11/24/18   Eustace Moore, MD  hydrOXYzine  (ATARAX/VISTARIL) 25 MG tablet Take 1 tablet (25 mg total) by mouth every 6 (six) hours as needed for anxiety. 11/24/18   Eustace Moore, MD    Family History Family History  Problem Relation Age of Onset  . Healthy Mother   . Healthy Father     Social History Social History   Tobacco Use  . Smoking status: Never Smoker  . Smokeless tobacco: Never Used  Substance Use Topics  . Alcohol use: Yes    Comment: occ  . Drug use: Never     Allergies   Iodinated diagnostic agents; Iodine; and Mushroom extract complex   Review of Systems Review of Systems  Constitutional: Negative for chills and fever.  HENT: Negative for ear pain and sore throat.   Eyes: Negative for pain and visual disturbance.  Respiratory: Positive for chest tightness and shortness of breath. Negative for cough.        Shortness of breath and tightness in chest with panic attack  Cardiovascular: Positive for palpitations. Negative for chest pain.       Rapid heartbeat with panic attack  Gastrointestinal: Negative for abdominal pain and vomiting.  Genitourinary: Negative for dysuria and hematuria.  Musculoskeletal: Negative for arthralgias and back pain.  Skin: Negative for  color change and rash.  Neurological: Negative for seizures and syncope.  Psychiatric/Behavioral: The patient is nervous/anxious.   All other systems reviewed and are negative.    Physical Exam Triage Vital Signs ED Triage Vitals  Enc Vitals Group     BP 11/24/18 0915 125/79     Pulse Rate 11/24/18 0912 79     Resp 11/24/18 0912 18     Temp 11/24/18 0912 98.3 F (36.8 C)     Temp src --      SpO2 11/24/18 0912 96 %     Weight --      Height --      Head Circumference --      Peak Flow --      Pain Score 11/24/18 0915 4     Pain Loc --      Pain Edu? --      Excl. in GC? --    No data found.  Updated Vital Signs BP 125/79   Pulse 79   Temp 98.3 F (36.8 C)   Resp 18   SpO2 96%   Visual Acuity Right Eye  Distance:   Left Eye Distance:   Bilateral Distance:    Right Eye Near:   Left Eye Near:    Bilateral Near:     Physical Exam Constitutional:      General: He is not in acute distress.    Appearance: He is well-developed. He is obese.     Comments: Super obese  HENT:     Head: Normocephalic and atraumatic.  Eyes:     Conjunctiva/sclera: Conjunctivae normal.     Pupils: Pupils are equal, round, and reactive to light.  Neck:     Musculoskeletal: Normal range of motion.  Cardiovascular:     Rate and Rhythm: Normal rate and regular rhythm.     Heart sounds: Normal heart sounds.  Pulmonary:     Effort: Pulmonary effort is normal. No respiratory distress.     Breath sounds: Normal breath sounds. No wheezing, rhonchi or rales.  Abdominal:     General: There is no distension.     Palpations: Abdomen is soft.  Musculoskeletal: Normal range of motion.  Skin:    General: Skin is warm and dry.  Neurological:     General: No focal deficit present.     Mental Status: He is alert.  Psychiatric:        Mood and Affect: Mood normal.        Behavior: Behavior normal.      UC Treatments / Results  Labs (all labs ordered are listed, but only abnormal results are displayed) Labs Reviewed - No data to display  EKG None  Radiology Dg Chest 2 View  Result Date: 11/24/2018 CLINICAL DATA:  Chest pain and shortness of breath. EXAM: CHEST - 2 VIEW COMPARISON:  Two-view chest x-ray 09/29/2018 FINDINGS: Heart size is normal. Mild peribronchial thickening is again seen. There is no significant interval change. No focal airspace disease is present. There is no edema or effusion. IMPRESSION: 1. No acute cardiopulmonary disease or significant interval change. Electronically Signed   By: Marin Robertshristopher  Mattern M.D.   On: 11/24/2018 09:56    Procedures Procedures (including critical care time)  Medications Ordered in UC Medications - No data to display  Initial Impression / Assessment and  Plan / UC Course  I have reviewed the triage vital signs and the nursing notes.  Pertinent labs & imaging results that were available during my  care of the patient were reviewed by me and considered in my medical decision making (see chart for details).    I attempted to reassure the patient that I did not feel like he needed another chest x-ray.  He has had 2 of these in the last 4 months.  His lungs are clear.  Patient states that "if I go out to the car without having a chest x-ray my wife will send me right back in".   Final Clinical Impressions(s) / UC Diagnoses   Final diagnoses:  Anxiety  SOB (shortness of breath)     Discharge Instructions     Your physical examination is normal.  Lungs are clear and heart sounds normal. Your chest x-ray is normal. I believe your symptoms are from anxiety I have refilled your BuSpar for 30 days I have also refilled the hydroxyzine as needed for panic It is important that you establish with a new mental health provider to get refills    ED Prescriptions    Medication Sig Dispense Auth. Provider   busPIRone (BUSPAR) 10 MG tablet Take 1 tablet (10 mg total) by mouth 3 (three) times daily. 90 tablet Eustace Moore, MD   hydrOXYzine (ATARAX/VISTARIL) 25 MG tablet  (Status: Discontinued) Take 1 tablet (25 mg total) by mouth every 6 (six) hours as needed for anxiety. 12 tablet Eustace Moore, MD   hydrOXYzine (ATARAX/VISTARIL) 25 MG tablet Take 1 tablet (25 mg total) by mouth every 6 (six) hours as needed for anxiety. 12 tablet Eustace Moore, MD     Controlled Substance Prescriptions [place] Controlled Substance Registry consulted? Not Applicable   Eustace Moore, MD 11/24/18 1012

## 2019-03-21 ENCOUNTER — Encounter (HOSPITAL_COMMUNITY): Payer: Self-pay | Admitting: Physician Assistant

## 2019-03-21 ENCOUNTER — Ambulatory Visit (HOSPITAL_COMMUNITY)
Admission: EM | Admit: 2019-03-21 | Discharge: 2019-03-21 | Disposition: A | Discharge status: Home / Self Care | Payer: BLUE CROSS/BLUE SHIELD | Attending: Family Medicine | Admitting: Family Medicine

## 2019-03-21 ENCOUNTER — Telehealth (HOSPITAL_COMMUNITY): Payer: Self-pay | Admitting: Emergency Medicine

## 2019-03-21 ENCOUNTER — Other Ambulatory Visit: Payer: Self-pay

## 2019-03-21 DIAGNOSIS — K047 Periapical abscess without sinus: Secondary | ICD-10-CM

## 2019-03-21 MED ORDER — CHLORHEXIDINE GLUCONATE 0.12 % MT SOLN: 15.0000 mL | Freq: Two times a day (BID) | OROMUCOSAL | 0 refills | Status: DC

## 2019-03-21 MED ORDER — CLINDAMYCIN HCL 150 MG PO CAPS: 450.0000 mg | ORAL_CAPSULE | Freq: Three times a day (TID) | ORAL | 0 refills | Status: AC

## 2019-03-21 MED ORDER — CLINDAMYCIN HCL 150 MG PO CAPS: 450.0000 mg | ORAL_CAPSULE | Freq: Three times a day (TID) | ORAL | 0 refills | Status: DC

## 2019-03-21 NOTE — ED Notes (Signed)
Patient appeared in room 5 .  Reports being in bathroom.  Provided instructions and rerouted medications to preferred pharmacy.

## 2019-03-21 NOTE — ED Provider Notes (Signed)
[facility]    CSN: [id number] Arrival date & time: 03/21/19  1032      History   Chief Complaint No chief complaint on file.   HPI Jesse Mckinney is a 33 y.o. male.   33 year old male comes in for evaluation of right dental/gum pain.  About 5 days ago, he had a tooth extracted.  He was put on penicillin, and was given Tylenol #3 for the pain.  Patient has had good relief until 2 days ago, noticed that the suture fell out.  He has since then have increased pain, swelling to the gum, and foul smell.  He denies any fever, chills, body aches.  Denies swelling of the throat, tripoding, drooling, trismus. He has 2 days of penicillin left.      Past Medical History:  Diagnosis Date  . Anxiety   . GERD (gastroesophageal reflux disease)   . Panic attack     There are no active problems to display for this patient.   Past Surgical History:  Procedure Laterality Date  . KNEE SURGERY         Home Medications    Prior to Admission medications   Medication Sig Start Date End Date Taking? Authorizing Provider  busPIRone (BUSPAR) 10 MG tablet Take 1 tablet (10 mg total) by mouth 3 (three) times daily. 11/24/18   Eustace MooreNelson, Yvonne Sue, MD  chlorhexidine (PERIDEX) 0.12 % solution Use as directed 15 mLs in the mouth or throat 2 (two) times daily. 03/21/19   Cathie HoopsYu, Amy V, PA-C  clindamycin (CLEOCIN) 150 MG capsule Take 3 capsules (450 mg total) by mouth 3 (three) times daily for 7 days. 03/21/19 03/28/19  Belinda FisherYu, Amy V, PA-C    Family History Family History  Problem Relation Age of Onset  . Healthy Mother   . Healthy Father     Social History Social History   Tobacco Use  . Smoking status: Never Smoker  . Smokeless tobacco: Never Used  Substance Use Topics  . Alcohol use: Yes    Comment: occ  . Drug use: Never     Allergies   Iodinated diagnostic agents, Iodine, and Mushroom extract complex   Review of Systems Review of Systems  Reason unable to perform ROS: See  HPI as above.     Physical Exam Triage Vital Signs ED Triage Vitals  Enc Vitals Group     BP      Pulse      Resp      Temp      Temp src      SpO2      Weight      Height      Head Circumference      Peak Flow      Pain Score      Pain Loc      Pain Edu?      Excl. in GC?    No data found.  Updated Vital Signs BP 130/81   Pulse 67   Temp 98.4 F (36.9 C) (Oral)   SpO2 98%   Physical Exam Constitutional:      General: He is not in acute distress.    Appearance: He is well-developed. He is not ill-appearing, toxic-appearing or diaphoretic.  HENT:     Head: Normocephalic and atraumatic.     Jaw: No trismus.     Mouth/Throat:     Mouth: Mucous membranes are moist.     Pharynx: Oropharynx is clear. Uvula midline. No uvula  swelling.     Tonsils: No tonsillar exudate.      Comments: Tenderness to palpation right upper gumline with mild swelling. No fluctuance felt.   Floor of mouth soft to palpation. No facial swelling.  Eyes:     Conjunctiva/sclera: Conjunctivae normal.     Pupils: Pupils are equal, round, and reactive to light.  Neck:     Musculoskeletal: Normal range of motion and neck supple.  Skin:    General: Skin is warm and dry.  Neurological:     Mental Status: He is alert and oriented to person, place, and time.     UC Treatments / Results  Labs (all labs ordered are listed, but only abnormal results are displayed) Labs Reviewed - No data to display  EKG   Radiology No results found.  Procedures Procedures (including critical care time)  Medications Ordered in UC Medications - No data to display  Initial Impression / Assessment and Plan / UC Course  I have reviewed the triage vital signs and the nursing notes.  Pertinent labs & imaging results that were available during my care of the patient were reviewed by me and considered in my medical decision making (see chart for details).    Will switch patient to clindamycin and  discontinue penicillin at this time.  Will use Peridex to help with oral hygiene.  Discussed symptomatic treatment.  Patient to contact dentist tomorrow to update current symptoms.  Return precautions given.  Patient expresses understanding and agrees to plan.   Final Clinical Impressions(s) / UC Diagnoses   Final diagnoses:  Dental infection   ED Prescriptions    Medication Sig Dispense Auth. Provider   clindamycin (CLEOCIN) 150 MG capsule Take 3 capsules (450 mg total) by mouth 3 (three) times daily for 7 days. 63 capsule Yu, Amy V, PA-C   chlorhexidine (PERIDEX) 0.12 % solution Use as directed 15 mLs in the mouth or throat 2 (two) times daily. 120 mL Tobin Chad, [place] 03/21/19 1240

## 2019-03-21 NOTE — Discharge Instructions (Signed)
Discontinue penicillin, start clindamycin as directed.  Start Peridex for dental hygiene.  Start ibuprofen 800 mg 3 times a day, and you can supplement with your Tylenol 3 if needed for pain.  Please contact your dentist tomorrow for update of current symptoms. If experiencing swelling of the throat, trouble breathing, trouble swallowing, leaning forward to breath, drooling, go to the emergency department for further evaluation.

## 2019-03-23 ENCOUNTER — Emergency Department (HOSPITAL_COMMUNITY)
Admission: EM | Admit: 2019-03-23 | Discharge: 2019-03-23 | Discharge status: Against Medical Advice | Payer: BC Managed Care – PPO | Attending: Emergency Medicine | Admitting: Emergency Medicine

## 2019-03-23 ENCOUNTER — Emergency Department (HOSPITAL_COMMUNITY): Payer: BC Managed Care – PPO

## 2019-03-23 ENCOUNTER — Other Ambulatory Visit: Payer: Self-pay

## 2019-03-23 DIAGNOSIS — R0789 Other chest pain: Secondary | ICD-10-CM | POA: Insufficient documentation

## 2019-03-23 DIAGNOSIS — Z91041 Radiographic dye allergy status: Secondary | ICD-10-CM | POA: Insufficient documentation

## 2019-03-23 DIAGNOSIS — R0602 Shortness of breath: Secondary | ICD-10-CM | POA: Diagnosis not present

## 2019-03-23 DIAGNOSIS — R079 Chest pain, unspecified: Secondary | ICD-10-CM

## 2019-03-23 DIAGNOSIS — Z79899 Other long term (current) drug therapy: Secondary | ICD-10-CM | POA: Insufficient documentation

## 2019-03-23 DIAGNOSIS — Z5329 Procedure and treatment not carried out because of patient's decision for other reasons: Secondary | ICD-10-CM

## 2019-03-23 DIAGNOSIS — F419 Anxiety disorder, unspecified: Secondary | ICD-10-CM | POA: Diagnosis present

## 2019-03-23 NOTE — ED Notes (Signed)
Patient refuses labs, EKG and Covid swab.

## 2019-03-23 NOTE — ED Triage Notes (Signed)
Per EMS-states started having anxiety while playing video games-states he got in an argument with his wife yesterday-doesn't know if that is what is causing his anxiety-states he takes Buspar for anxiety

## 2019-03-23 NOTE — ED Provider Notes (Signed)
[facility]-EMERGENCY DEPT Provider Note   CSN: [id number] Arrival date & time: 03/23/19  1520     History   Chief Complaint Chief Complaint  Patient presents with  . Anxiety       HPI   Blood pressure (!) 146/90, pulse 69, temperature 99 F (37.2 C), temperature source Oral, resp. rate 20, height 6\' 5"  (1.956 m), weight (!) 195 kg, SpO2 96 %.  Herby AbrahamMarlin Brougham is a 33 y.o. male complaining of chest pain and shortness of breath onset yesterday after getting to an argument with his wife, now completely resolved.  He has a history of anxiety and panic attack but he states that normally he can calm himself down and do breathing exercises and this time the sensation did not go away.  He denies any history of diabetes, hypertension, hyperlipidemia, tobacco use, family history of early cardiac death, personal history of DVT/PE, recent immobilizations, calf pain, leg swelling.  States that he has been prescribed BuSpar for his anxiety and has been taking that regularly.  He is worried that there is a dental infection that he has been treated for with antibiotics and is worried that that is somehow affecting his heart.  He denies any fevers, chills, history of IV drug use.  Past Medical History:  Diagnosis Date  . Anxiety   . GERD (gastroesophageal reflux disease)   . Panic attack     There are no active problems to display for this patient.   Past Surgical History:  Procedure Laterality Date  . KNEE SURGERY          Home Medications    Prior to Admission medications   Medication Sig Start Date End Date Taking? Authorizing Provider  busPIRone (BUSPAR) 10 MG tablet Take 1 tablet (10 mg total) by mouth 3 (three) times daily. 11/24/18   Eustace MooreNelson, Yvonne Sue, MD  chlorhexidine (PERIDEX) 0.12 % solution Use as directed 15 mLs in the mouth or throat 2 (two) times daily. 03/21/19   Cathie HoopsYu, Amy V, PA-C  clindamycin (CLEOCIN) 150 MG capsule Take 3 capsules (450 mg total) by  mouth 3 (three) times daily for 7 days. 03/21/19 03/28/19  Belinda FisherYu, Amy V, PA-C    Family History Family History  Problem Relation Age of Onset  . Healthy Mother   . Healthy Father     Social History Social History   Tobacco Use  . Smoking status: Never Smoker  . Smokeless tobacco: Never Used  Substance Use Topics  . Alcohol use: Yes    Comment: occ  . Drug use: Never     Allergies   Iodinated diagnostic agents, Iodine, and Mushroom extract complex   Review of Systems Review of Systems  A complete review of systems was obtained and all systems are negative except as noted in the HPI and PMH.   Physical Exam Updated Vital Signs BP 140/79   Pulse (!) 59   Temp 98.3 F (36.8 C) (Oral)   Resp 20   Ht 6\' 5"  (1.956 m)   Wt (!) 195 kg   SpO2 97%   BMI 50.99 kg/m   Physical Exam Vitals signs and nursing note reviewed.  Constitutional:      General: He is not in acute distress.    Appearance: He is well-developed. He is not diaphoretic.  HENT:     Head: Normocephalic.  Eyes:     Conjunctiva/sclera: Conjunctivae normal.  Neck:     Musculoskeletal: Normal range of motion.  Vascular: No JVD.     Trachea: No tracheal deviation.  Cardiovascular:     Rate and Rhythm: Normal rate and regular rhythm.     Comments: Radial pulse equal bilaterally Pulmonary:     Effort: Pulmonary effort is normal. No respiratory distress.     Breath sounds: Normal breath sounds. No stridor. No wheezing or rales.  Chest:     Chest wall: No tenderness.  Abdominal:     General: There is no distension.     Palpations: Abdomen is soft. There is no mass.     Tenderness: There is no abdominal tenderness. There is no guarding or rebound.  Musculoskeletal: Normal range of motion.        General: No tenderness.     Comments: No calf asymmetry, superficial collaterals, palpable cords, edema, Homans sign negative bilaterally.    Skin:    General: Skin is warm.  Neurological:     Mental  Status: He is alert and oriented to person, place, and time.      ED Treatments / Results  Labs (all labs ordered are listed, but only abnormal results are displayed) Labs Reviewed - No data to display  EKG None  Radiology Dg Chest 2 View  Result Date: 03/23/2019 CLINICAL DATA:  Chest pain EXAM: CHEST - 2 VIEW COMPARISON:  Radiograph 11/24/2018 FINDINGS: Accounting for body habitus, the lungs are clear. No consolidation, features of edema, pneumothorax, or effusion. Pulmonary vascularity is normally distributed. The cardiomediastinal contours are unremarkable. No acute osseous or soft tissue abnormality. IMPRESSION: No acute cardiopulmonary abnormality Electronically Signed   By: Kreg Shropshire M.D.   On: 03/23/2019 16:35    Procedures Procedures (including critical care time)  Medications Ordered in ED Medications - No data to display   Initial Impression / Assessment and Plan / ED Course  I have reviewed the triage vital signs and the nursing notes.  Pertinent labs & imaging results that were available during my care of the patient were reviewed by me and considered in my medical decision making (see chart for details).        Vitals:   03/23/19 1529 03/23/19 1746 03/23/19 1748 03/23/19 1748  BP: (!) 146/90 140/79    Pulse: 69  (!) 59   Resp: 20   20  Temp: 99 F (37.2 C)   98.3 F (36.8 C)  TempSrc: Oral   Oral  SpO2: 96%  97%   Weight: (!) 195 kg     Height: 6\' 5"  (1.956 m)       Christ Kirn is 33 y.o. male presenting with resolved chest pain and shortness of breath, this was not completely typical for his standard anxiety, panic attacks.  The pain has completely resolved, given his borderline diabetes and obesity I think it is reasonable to do a cardiac work-up.  Patient is initially amenable to this but then declines EKG.  Patient is alert, oriented x3, can meaningfully discussed the pros and cons of foregoing work-up in the emergency department and  understand the risk involved but are not limited to death and disability.  Patient states he will follow with PCP, he understands he can change his mind at any time to return to the emergency department for evaluation but patient chooses to leave AGAINST MEDICAL ADVICE at this time.    Final Clinical Impressions(s) / ED Diagnoses   Final diagnoses:  Left against medical advice  Chest pain, unspecified type    ED Discharge Orders  None       Tylia Ewell, Charna Elizabeth 03/23/19 2032    Maudie Flakes, MD 03/25/19 [phone]

## 2019-03-23 NOTE — Discharge Instructions (Addendum)
You left the emergency room AGAINST MEDICAL ADVICE, you can change your mind at any time and return for full testing, you can also dial 911.

## 2019-04-20 ENCOUNTER — Emergency Department (HOSPITAL_COMMUNITY): Payer: BC Managed Care – PPO

## 2019-04-20 ENCOUNTER — Emergency Department (HOSPITAL_COMMUNITY)
Admission: EM | Admit: 2019-04-20 | Discharge: 2019-04-20 | Disposition: A | Discharge status: Home / Self Care | Payer: BC Managed Care – PPO | Attending: Emergency Medicine | Admitting: Emergency Medicine

## 2019-04-20 DIAGNOSIS — R0789 Other chest pain: Secondary | ICD-10-CM | POA: Diagnosis not present

## 2019-04-20 LAB — TROPONIN I (HIGH SENSITIVITY): Troponin I (High Sensitivity): 6 ng/L (ref <18)

## 2019-04-20 LAB — COMPREHENSIVE METABOLIC PANEL
ALT: 19 U/L (ref 0–44)
AST: 28 U/L (ref 15–41)
Albumin: 3.6 g/dL (ref 3.5–5.0)
Alkaline Phosphatase: 61 U/L (ref 38–126)
Anion gap: 8 (ref 5–15)
BUN: 12 mg/dL (ref 6–20)
CO2: 26 mmol/L (ref 22–32)
Calcium: 9.1 mg/dL (ref 8.9–10.3)
Chloride: 106 mmol/L (ref 98–111)
Creatinine, Ser: 1.09 mg/dL (ref 0.61–1.24)
GFR calc Af Amer: >60 mL/min (ref >60)
GFR calc non Af Amer: >60 mL/min (ref >60)
Glucose, Bld: 90 mg/dL (ref 70–99)
Potassium: 4.7 mmol/L (ref 3.5–5.1)
Sodium: 140 mmol/L (ref 135–145)
Total Bilirubin: 1.3 mg/dL — ABNORMAL HIGH (ref 0.3–1.2)
Total Protein: 6.7 g/dL (ref 6.5–8.1)

## 2019-04-20 LAB — CBC WITH DIFFERENTIAL/PLATELET
Abs Immature Granulocytes: 0.03 10*3/uL (ref 0.00–0.07)
Basophils Absolute: 0 10*3/uL (ref 0.0–0.1)
Basophils Relative: 0 %
Eosinophils Absolute: 0.1 10*3/uL (ref 0.0–0.5)
Eosinophils Relative: 1 %
HCT: 45.1 % (ref 39.0–52.0)
Hemoglobin: 14.5 g/dL (ref 13.0–17.0)
Immature Granulocytes: 0 %
Lymphocytes Relative: 29 %
Lymphs Abs: 2 10*3/uL (ref 0.7–4.0)
MCH: 27.4 pg (ref 26.0–34.0)
MCHC: 32.2 g/dL (ref 30.0–36.0)
MCV: 85.1 fL (ref 80.0–100.0)
Monocytes Absolute: 0.5 10*3/uL (ref 0.1–1.0)
Monocytes Relative: 8 %
Neutro Abs: 4.2 10*3/uL (ref 1.7–7.7)
Neutrophils Relative %: 62 %
Platelets: 193 10*3/uL (ref 150–400)
RBC: 5.3 MIL/uL (ref 4.22–5.81)
RDW: 13.7 % (ref 11.5–15.5)
WBC: 6.8 10*3/uL (ref 4.0–10.5)
nRBC: 0 % (ref 0.0–0.2)

## 2019-04-20 LAB — PROTIME-INR
INR: 1.2 (ref 0.8–1.2)
Prothrombin Time: 15.5 seconds — ABNORMAL HIGH (ref 11.4–15.2)

## 2019-04-20 LAB — BRAIN NATRIURETIC PEPTIDE: B Natriuretic Peptide: 30.8 pg/mL (ref 0.0–100.0)

## 2019-04-20 LAB — MAGNESIUM: Magnesium: 2.1 mg/dL (ref 1.7–2.4)

## 2019-04-20 MED ORDER — ONDANSETRON HCL 4 MG/2ML IJ SOLN: 4.0000 mg | Freq: Once | INTRAMUSCULAR | Status: DC

## 2019-04-20 MED ORDER — NALOXONE HCL 0.4 MG/ML IJ SOLN: 0.4000 mg | Freq: Once | INTRAMUSCULAR | Status: DC

## 2019-04-20 MED ORDER — PANTOPRAZOLE SODIUM 40 MG PO TBEC: 40.0000 mg | DELAYED_RELEASE_TABLET | Freq: Every day | ORAL | 0 refills | Status: DC

## 2019-04-20 MED ORDER — ASPIRIN 81 MG PO CHEW: 324.0000 mg | CHEWABLE_TABLET | Freq: Once | ORAL | Status: DC

## 2019-04-20 NOTE — ED Triage Notes (Signed)
Brought in by EMS for intermittent chest pain x 1 week. Pain was worse today and also had shortness of breath. Pt states the pain lasted longer today than it has been. 325 mg Aspirin taken prior to EMS arrival.

## 2019-04-20 NOTE — Discharge Instructions (Signed)
Begin taking Protonix once daily as prescribed.  Follow-up with your primary doctor if symptoms or not improving, and return to the ER if symptoms worsen or change.

## 2019-04-20 NOTE — ED Provider Notes (Signed)
[facility] EMERGENCY DEPARTMENT Provider Note   CSN: [id number] Arrival date & time: 04/20/19  1325     History   Chief Complaint Chief Complaint  Patient presents with  . Chest Pain    HPI Jesse Mckinney is a 33 y.o. male.     HPI Patient presents concern of chest pain. He notes that over the past weeks, and indeed over the past days he has had increasing discomfort in his chest, with supine positioning primarily. With activity the pain is not appreciably worse, and is better throughout the day when he is upright. There is some associated dyspnea, no cough, no fever, no nausea, no vomiting.  He does have a history of GERD, has not taken a PPI in years. He takes his medication for anxiety regularly. He does not smoke. He has no history of cardiac disease.  Past Medical History:  Diagnosis Date  . Anxiety   . GERD (gastroesophageal reflux disease)   . Panic attack     There are no active problems to display for this patient.   Past Surgical History:  Procedure Laterality Date  . KNEE SURGERY          Home Medications    Prior to Admission medications   Medication Sig Start Date End Date Taking? Authorizing Provider  busPIRone (BUSPAR) 10 MG tablet Take 1 tablet (10 mg total) by mouth 3 (three) times daily. 11/24/18  Yes Eustace Moore, MD  Multiple Vitamin (MULTIVITAMIN WITH MINERALS) TABS tablet Take 1 tablet by mouth daily.   Yes [provider]  Vitamin D, Ergocalciferol, (DRISDOL) 1.25 MG (50000 UT) CAPS capsule Take 50,000 Units by mouth once a week. Wednesday 02/12/19  Yes [provider]  chlorhexidine (PERIDEX) 0.12 % solution Use as directed 15 mLs in the mouth or throat 2 (two) times daily. Patient not taking: Reported on 04/20/2019 03/21/19   Lurline Idol    Family History Family History  Problem Relation Age of Onset  . Healthy Mother   . Healthy Father     Social History Social History   Tobacco  Use  . Smoking status: Never Smoker  . Smokeless tobacco: Never Used  Substance Use Topics  . Alcohol use: Yes    Comment: occ  . Drug use: Never     Allergies   Iodinated diagnostic agents, Iodine, and Mushroom extract complex   Review of Systems Review of Systems  Constitutional:       Per HPI, otherwise negative  HENT:       Per HPI, otherwise negative  Respiratory:       Per HPI, otherwise negative  Cardiovascular:       Per HPI, otherwise negative  Gastrointestinal: Negative for vomiting.  Endocrine:       Negative aside from HPI  Genitourinary:       Neg aside from HPI   Musculoskeletal:       Per HPI, otherwise negative  Skin: Negative.   Neurological: Negative for syncope.     Physical Exam Updated Vital Signs BP 118/62 (BP Location: Right Arm)   Temp 98.5 F (36.9 C) (Oral)   Resp 18   Ht 6\' 5"  (1.956 m)   Wt (!) 192.8 kg   SpO2 97%   BMI 50.40 kg/m   Physical Exam Vitals signs and nursing note reviewed.  Constitutional:      General: He is not in acute distress.    Appearance: He is well-developed.  HENT:     Head: Normocephalic and atraumatic.  Eyes:     Conjunctiva/sclera: Conjunctivae normal.  Cardiovascular:     Rate and Rhythm: Normal rate and regular rhythm.  Pulmonary:     Effort: Pulmonary effort is normal. No respiratory distress.     Breath sounds: No stridor.  Abdominal:     General: There is no distension.  Skin:    General: Skin is warm and dry.  Neurological:     Mental Status: He is alert and oriented to person, place, and time.      ED Treatments / Results  Labs (all labs ordered are listed, but only abnormal results are displayed) Labs Reviewed  COMPREHENSIVE METABOLIC PANEL  MAGNESIUM  BRAIN NATRIURETIC PEPTIDE  CBC WITH DIFFERENTIAL/PLATELET  PROTIME-INR  CBG MONITORING, ED  TROPONIN I (HIGH SENSITIVITY)  TROPONIN I (HIGH SENSITIVITY)    EKG EKG Interpretation  Date/Time:  Tuesday April 20 2019  13:47:30 EDT Ventricular Rate:  60 PR Interval:    QRS Duration: 93 QT Interval:  391 QTC Calculation: 391 R Axis:   68 Text Interpretation:  Sinus rhythm Low voltage, precordial leads Otherwise within normal limits Confirmed by Carmin Muskrat [phone]) on 04/20/2019 1:49:42 PM   Radiology Dg Chest Portable 1 View  Result Date: 04/20/2019 CLINICAL DATA:  Chest pain lower mid chest today which shortness-of-breath upon wakening. EXAM: PORTABLE CHEST 1 VIEW COMPARISON:  03/23/2019 FINDINGS: Lungs are adequately inflated and otherwise clear. Cardiomediastinal silhouette and remainder of the exam is unchanged. IMPRESSION: No active disease. Electronically Signed   By: Marin Olp M.D.   On: 04/20/2019 14:31    Procedures Procedures (including critical care time)  Medications Ordered in ED Medications  aspirin chewable tablet 324 mg (324 mg Oral Not Given 04/20/19 1402)     Initial Impression / Assessment and Plan / ED Course  I have reviewed the triage vital signs and the nursing notes.  Pertinent labs & imaging results that were available during my care of the patient were reviewed by me and considered in my medical decision making (see chart for details).        Initial x-ray reassuring, EKG reassuring. This large adult male presents with chest pain, worse with supine positioning. Some suspicion for gastroesophageal disease given his reassuring initial findings per Patient's labs, including serial troponins are pending. Dr. Lanny Cramp is aware of the patient, will disposition appropriately.  Final Clinical Impressions(s) / ED Diagnoses  Atypical chest pain   Carmin Muskrat, MD 04/20/19 1609

## 2019-04-20 NOTE — ED Notes (Signed)
Patient is on montior

## 2019-04-20 NOTE — ED Provider Notes (Signed)
Care assumed from Dr. Vanita Panda at shift change.  Patient awaiting results of a second troponin.  He presented here with complaints of chest discomfort that seems noncardiac in nature.  His EKG is unchanged and second troponin has returned negative.  Patient reevaluated and tells me that his symptoms have subsided and he is feeling better.  He is comfortable with going home.  Patient will be prescribed Protonix and advised to follow-up with primary doctor as needed if not improving.   Veryl Speak, MD 04/20/19 [phone]

## 2019-05-26 ENCOUNTER — Encounter (HOSPITAL_COMMUNITY): Payer: Self-pay

## 2019-05-26 ENCOUNTER — Emergency Department (HOSPITAL_COMMUNITY)
Admission: EM | Admit: 2019-05-26 | Discharge: 2019-05-27 | Disposition: A | Discharge status: Home / Self Care | Payer: BC Managed Care – PPO | Attending: Emergency Medicine | Admitting: Emergency Medicine

## 2019-05-26 ENCOUNTER — Other Ambulatory Visit: Payer: Self-pay

## 2019-05-26 DIAGNOSIS — R42 Dizziness and giddiness: Secondary | ICD-10-CM | POA: Insufficient documentation

## 2019-05-26 DIAGNOSIS — Z5321 Procedure and treatment not carried out due to patient leaving prior to being seen by health care provider: Secondary | ICD-10-CM | POA: Insufficient documentation

## 2019-05-26 LAB — CBC
HCT: 45.4 % (ref 39.0–52.0)
Hemoglobin: 13.8 g/dL (ref 13.0–17.0)
MCH: 26.3 pg (ref 26.0–34.0)
MCHC: 30.4 g/dL (ref 30.0–36.0)
MCV: 86.6 fL (ref 80.0–100.0)
Platelets: 197 10*3/uL (ref 150–400)
RBC: 5.24 MIL/uL (ref 4.22–5.81)
RDW: 13.7 % (ref 11.5–15.5)
WBC: 9.7 10*3/uL (ref 4.0–10.5)
nRBC: 0 % (ref 0.0–0.2)

## 2019-05-26 LAB — CBG MONITORING, ED: Glucose-Capillary: 96 mg/dL (ref 70–99)

## 2019-05-26 LAB — BASIC METABOLIC PANEL
Anion gap: 8 (ref 5–15)
BUN: 11 mg/dL (ref 6–20)
CO2: 27 mmol/L (ref 22–32)
Calcium: 9.2 mg/dL (ref 8.9–10.3)
Chloride: 106 mmol/L (ref 98–111)
Creatinine, Ser: 0.92 mg/dL (ref 0.61–1.24)
GFR calc Af Amer: >60 mL/min (ref >60)
GFR calc non Af Amer: >60 mL/min (ref >60)
Glucose, Bld: 86 mg/dL (ref 70–99)
Potassium: 3.7 mmol/L (ref 3.5–5.1)
Sodium: 141 mmol/L (ref 135–145)

## 2019-05-26 MED ORDER — SODIUM CHLORIDE 0.9% FLUSH: 3.0000 mL | Freq: Once | INTRAVENOUS | Status: DC

## 2019-05-26 NOTE — ED Triage Notes (Signed)
Pt BIBA from home. At 1645, pt had lightheadedness and dizziness. Hx of anxiety, EMS said pt was anxious appearing.  At this time, pt only c/o dizziness, lightheadedness has subsided.

## 2019-05-31 ENCOUNTER — Other Ambulatory Visit: Payer: Self-pay | Admitting: Physician Assistant

## 2019-05-31 DIAGNOSIS — R519 Headache, unspecified: Secondary | ICD-10-CM

## 2019-06-03 ENCOUNTER — Other Ambulatory Visit: Payer: Self-pay | Admitting: Physician Assistant

## 2019-06-07 ENCOUNTER — Ambulatory Visit
Admission: RE | Admit: 2019-06-07 | Discharge: 2019-06-07 | Disposition: A | Discharge status: Home / Self Care | Payer: BC Managed Care – PPO | Source: Ambulatory Visit | Attending: Physician Assistant | Admitting: Physician Assistant

## 2019-06-07 ENCOUNTER — Other Ambulatory Visit: Payer: Self-pay

## 2019-06-07 DIAGNOSIS — R519 Headache, unspecified: Secondary | ICD-10-CM

## 2019-08-16 ENCOUNTER — Emergency Department (HOSPITAL_BASED_OUTPATIENT_CLINIC_OR_DEPARTMENT_OTHER): Payer: BC Managed Care – PPO

## 2019-08-16 ENCOUNTER — Encounter (HOSPITAL_BASED_OUTPATIENT_CLINIC_OR_DEPARTMENT_OTHER): Payer: Self-pay | Admitting: *Deleted

## 2019-08-16 ENCOUNTER — Other Ambulatory Visit: Payer: Self-pay

## 2019-08-16 ENCOUNTER — Emergency Department (HOSPITAL_BASED_OUTPATIENT_CLINIC_OR_DEPARTMENT_OTHER)
Admission: EM | Admit: 2019-08-16 | Discharge: 2019-08-16 | Disposition: A | Discharge status: Home / Self Care | Payer: BC Managed Care – PPO | Attending: Emergency Medicine | Admitting: Emergency Medicine

## 2019-08-16 DIAGNOSIS — U071 COVID-19: Secondary | ICD-10-CM | POA: Diagnosis not present

## 2019-08-16 DIAGNOSIS — R0602 Shortness of breath: Secondary | ICD-10-CM | POA: Diagnosis present

## 2019-08-16 NOTE — Discharge Instructions (Addendum)
You were evaluated in the Emergency Department and after careful evaluation, we did not find any emergent condition requiring admission or further testing in the hospital.  Your exam/testing today is overall reassuring.  Symptoms seem to be explained by COVID-19.  Your x-ray today was normal.  Please continue home quarantine and use Tylenol or Motrin at home for discomfort.  Please return to the Emergency Department if you experience any worsening of your condition.  We encourage you to follow up with a primary care provider.  Thank you for allowing [place] to be a part of your care.

## 2019-08-16 NOTE — ED Provider Notes (Signed)
[facility] Emergency Department Provider Note MRN:  [id number]  Arrival date & time: 08/16/19     Chief Complaint   Shortness of Breath (covid pos)   History of Present Illness   Jesse Mckinney is a 34 y.o. year-old male with a history of GERD presenting to the ED with chief complaint of shortness of breath.  Began feeling chills, fever, general malaise on January 5.  Tested positive for COVID-19 on January 11.  Continued generalized body aches, yesterday evening began experiencing some mild shortness of breath with lying flat.  Denies leg pain or swelling, no chest pain.  Symptoms mild, constant, no exacerbating relieving factors.  Review of Systems  A complete 10 system review of systems was obtained and all systems are negative except as noted in the HPI and PMH.   Patient's Health History    Past Medical History:  Diagnosis Date  . Anxiety   . GERD (gastroesophageal reflux disease)   . Panic attack     Past Surgical History:  Procedure Laterality Date  . KNEE SURGERY      Family History  Problem Relation Age of Onset  . Healthy Mother   . Healthy Father     Social History   Socioeconomic History  . Marital status: Married    Spouse name: Not on file  . Number of children: Not on file  . Years of education: Not on file  . Highest education level: Not on file  Occupational History  . Not on file  Tobacco Use  . Smoking status: Never Smoker  . Smokeless tobacco: Never Used  Substance and Sexual Activity  . Alcohol use: Yes    Comment: occ  . Drug use: Never  . Sexual activity: Not on file  Other Topics Concern  . Not on file  Social History Narrative  . Not on file   Social Determinants of Health   Financial Resource Strain:   . Difficulty of Paying Living Expenses: Not on file  Food Insecurity:   . Worried About Charity fundraiser in the Last Year: Not on file  . Ran Out of Food in the Last Year: Not on file    Transportation Needs:   . Lack of Transportation (Medical): Not on file  . Lack of Transportation (Non-Medical): Not on file  Physical Activity:   . Days of Exercise per Week: Not on file  . Minutes of Exercise per Session: Not on file  Stress:   . Feeling of Stress : Not on file  Social Connections:   . Frequency of Communication with Friends and Family: Not on file  . Frequency of Social Gatherings with Friends and Family: Not on file  . Attends Religious Services: Not on file  . Active Member of Clubs or Organizations: Not on file  . Attends Archivist Meetings: Not on file  . Marital Status: Not on file  Intimate Partner Violence:   . Fear of Current or Ex-Partner: Not on file  . Emotionally Abused: Not on file  . Physically Abused: Not on file  . Sexually Abused: Not on file     Physical Exam  Vital Signs and Nursing Notes reviewed Vitals:   08/16/19 1335  BP: 128/89  Pulse: 87  Resp: 18  Temp: 98.8 F (37.1 C)  SpO2: 99%    CONSTITUTIONAL: Well-appearing, NAD NEURO:  Alert and oriented x 3, no focal deficits EYES:  eyes equal and reactive ENT/NECK:  no LAD,  no JVD CARDIO: Regular rate, well-perfused, normal S1 and S2 PULM:  CTAB no wheezing or rhonchi GI/GU:  normal bowel sounds, non-distended, non-tender MSK/SPINE:  No gross deformities, no edema SKIN:  no rash, atraumatic PSYCH:  Appropriate speech and behavior  Diagnostic and Interventional Summary    EKG Interpretation  Date/Time:    Ventricular Rate:    PR Interval:    QRS Duration:   QT Interval:    QTC Calculation:   R Axis:     Text Interpretation:        Labs Reviewed - No data to display  DG Chest Portable 1 View  Final Result      Medications - No data to display   Procedures  /  Critical Care Procedures  ED Course and Medical Decision Making  I have reviewed the triage vital signs, the nursing notes, and pertinent available records from the EMR.  Pertinent labs &  imaging results that were available during my care of the patient were reviewed by me and considered in my medical decision making (see below for details).     Well-appearing, no increased work of breathing, no hypoxia, normal vital signs, clear lungs, x-ray reassuring, no leg pain or swelling, nothing to suggest pulmonary embolism, all symptoms consistent with continued COVID-19.  Provided reassurance, advised home quarantine.    Elmer Sow. Pilar Plate, MD [facility] Emergency Medicine [facility] [email or web address]  Final Clinical Impressions(s) / ED Diagnoses     ICD-10-CM   1. COVID-19  U07.1     ED Discharge Orders    None       Discharge Instructions Discussed with and Provided to Patient:     Discharge Instructions     You were evaluated in the Emergency Department and after careful evaluation, we did not find any emergent condition requiring admission or further testing in the hospital.  Your exam/testing today is overall reassuring.  Symptoms seem to be explained by COVID-19.  Your x-ray today was normal.  Please continue home quarantine and use Tylenol or Motrin at home for discomfort.  Please return to the Emergency Department if you experience any worsening of your condition.  We encourage you to follow up with a primary care provider.  Thank you for allowing [place] to be a part of your care.       Sabas Sous, MD 08/16/19 [phone]

## 2019-08-16 NOTE — ED Notes (Signed)
ED Provider at bedside. 

## 2019-08-16 NOTE — ED Triage Notes (Signed)
C/o SOB x 2 days, covid positive x 1 week

## 2019-10-21 ENCOUNTER — Emergency Department (HOSPITAL_COMMUNITY): Payer: BC Managed Care – PPO

## 2019-10-21 ENCOUNTER — Emergency Department (HOSPITAL_COMMUNITY)
Admission: EM | Admit: 2019-10-21 | Discharge: 2019-10-21 | Disposition: A | Discharge status: Home / Self Care | Payer: BC Managed Care – PPO | Attending: Emergency Medicine | Admitting: Emergency Medicine

## 2019-10-21 DIAGNOSIS — R0789 Other chest pain: Secondary | ICD-10-CM | POA: Insufficient documentation

## 2019-10-21 DIAGNOSIS — Z5321 Procedure and treatment not carried out due to patient leaving prior to being seen by health care provider: Secondary | ICD-10-CM | POA: Diagnosis not present

## 2019-10-21 LAB — CBC
HCT: 46.4 % (ref 39.0–52.0)
Hemoglobin: 14.4 g/dL (ref 13.0–17.0)
MCH: 26.5 pg (ref 26.0–34.0)
MCHC: 31 g/dL (ref 30.0–36.0)
MCV: 85.5 fL (ref 80.0–100.0)
Platelets: 190 10*3/uL (ref 150–400)
RBC: 5.43 MIL/uL (ref 4.22–5.81)
RDW: 13.7 % (ref 11.5–15.5)
WBC: 7.9 10*3/uL (ref 4.0–10.5)
nRBC: 0 % (ref 0.0–0.2)

## 2019-10-21 LAB — BASIC METABOLIC PANEL
Anion gap: 9 (ref 5–15)
BUN: 16 mg/dL (ref 6–20)
CO2: 24 mmol/L (ref 22–32)
Calcium: 9.3 mg/dL (ref 8.9–10.3)
Chloride: 107 mmol/L (ref 98–111)
Creatinine, Ser: 0.86 mg/dL (ref 0.61–1.24)
GFR calc Af Amer: >60 mL/min (ref >60)
GFR calc non Af Amer: >60 mL/min (ref >60)
Glucose, Bld: 97 mg/dL (ref 70–99)
Potassium: 3.9 mmol/L (ref 3.5–5.1)
Sodium: 140 mmol/L (ref 135–145)

## 2019-10-21 LAB — TROPONIN I (HIGH SENSITIVITY): Troponin I (High Sensitivity): 5 ng/L (ref <18)

## 2019-10-21 NOTE — ED Triage Notes (Signed)
Per [facility] pt from home reporting that upon standing up this morning had chest tightness. Reports hx anxiety and panic attacks. reports starting taking fertility medications again yesterday.  EKG was normal with EMS. Hr did vary from 60s and up.

## 2019-10-26 ENCOUNTER — Encounter (HOSPITAL_COMMUNITY): Payer: Self-pay

## 2019-10-26 ENCOUNTER — Other Ambulatory Visit: Payer: Self-pay

## 2019-10-26 ENCOUNTER — Emergency Department (HOSPITAL_COMMUNITY)
Admission: EM | Admit: 2019-10-26 | Discharge: 2019-10-26 | Disposition: A | Discharge status: Home / Self Care | Payer: BC Managed Care – PPO | Attending: Emergency Medicine | Admitting: Emergency Medicine

## 2019-10-26 DIAGNOSIS — Z79899 Other long term (current) drug therapy: Secondary | ICD-10-CM | POA: Diagnosis not present

## 2019-10-26 DIAGNOSIS — R002 Palpitations: Secondary | ICD-10-CM | POA: Diagnosis not present

## 2019-10-26 DIAGNOSIS — F419 Anxiety disorder, unspecified: Secondary | ICD-10-CM | POA: Insufficient documentation

## 2019-10-26 LAB — URINALYSIS, ROUTINE W REFLEX MICROSCOPIC
Bilirubin Urine: NEGATIVE
Glucose, UA: NEGATIVE mg/dL
Hgb urine dipstick: NEGATIVE
Ketones, ur: 20 mg/dL — AB
Leukocytes,Ua: NEGATIVE
Nitrite: NEGATIVE
Protein, ur: 30 mg/dL — AB
Specific Gravity, Urine: 1.03 (ref 1.005–1.030)
pH: 5 (ref 5.0–8.0)

## 2019-10-26 LAB — BASIC METABOLIC PANEL
Anion gap: 9 (ref 5–15)
BUN: 12 mg/dL (ref 6–20)
CO2: 26 mmol/L (ref 22–32)
Calcium: 9.1 mg/dL (ref 8.9–10.3)
Chloride: 108 mmol/L (ref 98–111)
Creatinine, Ser: 0.92 mg/dL (ref 0.61–1.24)
GFR calc Af Amer: >60 mL/min (ref >60)
GFR calc non Af Amer: >60 mL/min (ref >60)
Glucose, Bld: 87 mg/dL (ref 70–99)
Potassium: 3.8 mmol/L (ref 3.5–5.1)
Sodium: 143 mmol/L (ref 135–145)

## 2019-10-26 LAB — CBC WITH DIFFERENTIAL/PLATELET
Abs Immature Granulocytes: 0.02 10*3/uL (ref 0.00–0.07)
Basophils Absolute: 0 10*3/uL (ref 0.0–0.1)
Basophils Relative: 0 %
Eosinophils Absolute: 0 10*3/uL (ref 0.0–0.5)
Eosinophils Relative: 1 %
HCT: 47.5 % (ref 39.0–52.0)
Hemoglobin: 14.4 g/dL (ref 13.0–17.0)
Immature Granulocytes: 0 %
Lymphocytes Relative: 21 %
Lymphs Abs: 1.8 10*3/uL (ref 0.7–4.0)
MCH: 26.2 pg (ref 26.0–34.0)
MCHC: 30.3 g/dL (ref 30.0–36.0)
MCV: 86.5 fL (ref 80.0–100.0)
Monocytes Absolute: 0.6 10*3/uL (ref 0.1–1.0)
Monocytes Relative: 8 %
Neutro Abs: 5.9 10*3/uL (ref 1.7–7.7)
Neutrophils Relative %: 70 %
Platelets: 188 10*3/uL (ref 150–400)
RBC: 5.49 MIL/uL (ref 4.22–5.81)
RDW: 13.6 % (ref 11.5–15.5)
WBC: 8.4 10*3/uL (ref 4.0–10.5)
nRBC: 0 % (ref 0.0–0.2)

## 2019-10-26 LAB — RAPID URINE DRUG SCREEN, HOSP PERFORMED
Amphetamines: NOT DETECTED
Barbiturates: NOT DETECTED
Benzodiazepines: NOT DETECTED
Cocaine: NOT DETECTED
Opiates: NOT DETECTED
Tetrahydrocannabinol: NOT DETECTED

## 2019-10-26 LAB — MAGNESIUM: Magnesium: 2.2 mg/dL (ref 1.7–2.4)

## 2019-10-26 LAB — TSH: TSH: 0.406 u[IU]/mL (ref 0.350–4.500)

## 2019-10-26 MED ORDER — LORAZEPAM 1 MG PO TABS
1.0000 mg | ORAL_TABLET | Freq: Once | ORAL | Status: AC
  Administered 2019-10-26: 1 mg via ORAL
  Filled 2019-10-26: qty 1

## 2019-10-26 NOTE — Discharge Instructions (Addendum)
You have been seen today for palpitations. Please read and follow all provided instructions. Return to the emergency room for worsening condition or new concerning symptoms.     All of your lab work and EKG today are normal.  1. Medications:  Continue usual home medications Take medications as prescribed. Please review all of the medicines and only take them if you do not have an allergy to them.   2. Treatment: rest, drink plenty of fluids  3. Follow Up:  Please follow up with primary care provider by scheduling an appointment as soon as possible for a visit   ?

## 2019-10-26 NOTE — ED Provider Notes (Signed)
[facility]-EMERGENCY DEPT Provider Note   CSN: [id number] Arrival date & time: 10/26/19  1341     History Chief Complaint  Patient presents with  . Palpitations    Jesse Mckinney is a 34 y.o. male with past medical history significant for anxiety, GERD, panic attacks presents to emergency department today via EMS with chief complaint of palpitations.  Patient states he thought he was having a panic attack.  He describes it as he was standing in the shower washing his legs and when he stood up from bending over he had palpitations, shortness of breath and dizziness.  He got out of the shower and sat down.  He states his symptoms lasted for approximately 15 minutes until EMS arrived.  Patient states he has a history of panic attacks and this felt similar.  He is currently taking Buspar twice daily for anxiety.  He states he has been on this medication x1 year and ever since starting it he has had significantly decreased amount of panic attacks.  He denies any associated chest pain.  He denies fever, chills, headache, shortness of breath, abdominal pain, nausea, vomiting, urinary symptoms, diarrhea.  Denies any drug or alcohol use. Patient came to the emergency department 5 days ago for similar symptoms.  He had labs drawn, EKG and chest x-ray but left before seeing a provider because he needed to get home.  History provided by patient with additional history obtained from chart review.     Past Medical History:  Diagnosis Date  . Anxiety   . GERD (gastroesophageal reflux disease)   . Panic attack     There are no problems to display for this patient.   Past Surgical History:  Procedure Laterality Date  . KNEE SURGERY         Family History  Problem Relation Age of Onset  . Healthy Mother   . Healthy Father     Social History   Tobacco Use  . Smoking status: Never Smoker  . Smokeless tobacco: Never Used  Substance Use Topics  . Alcohol use: Yes   Comment: occ  . Drug use: Never    Home Medications Prior to Admission medications   Medication Sig Start Date End Date Taking? Authorizing Provider  busPIRone (BUSPAR) 10 MG tablet Take 1 tablet (10 mg total) by mouth 3 (three) times daily. 11/24/18   Eustace Moore, MD  chlorhexidine (PERIDEX) 0.12 % solution Use as directed 15 mLs in the mouth or throat 2 (two) times daily. Patient not taking: Reported on 04/20/2019 03/21/19   Belinda Fisher, PA-C  Multiple Vitamin (MULTIVITAMIN WITH MINERALS) TABS tablet Take 1 tablet by mouth daily.    [provider]  pantoprazole (PROTONIX) 40 MG tablet Take 1 tablet (40 mg total) by mouth daily. 04/20/19   Geoffery Lyons, MD  Vitamin D, Ergocalciferol, (DRISDOL) 1.25 MG (50000 UT) CAPS capsule Take 50,000 Units by mouth once a week. Wednesday 02/12/19   [provider]    Allergies    Iodinated diagnostic agents, Iodine, and Mushroom extract complex  Review of Systems   Review of Systems  All other systems are reviewed and are negative for acute change except as noted in the HPI.   Physical Exam Updated Vital Signs BP 133/80 (BP Location: Right Arm)   Pulse 65   Temp 98 F (36.7 C) (Oral)   Resp 16   Ht 6\' 6"  (1.981 m)   Wt (!) 195 kg   SpO2  98% Comment: Simultaneous filing. User may not have seen previous data.  BMI 49.69 kg/m   Physical Exam Vitals and nursing note reviewed.  Constitutional:      General: He is not in acute distress.    Appearance: He is obese. He is not ill-appearing.  HENT:     Head: Normocephalic and atraumatic.     Right Ear: Tympanic membrane and external ear normal.     Left Ear: Tympanic membrane and external ear normal.     Nose: Nose normal.     Mouth/Throat:     Mouth: Mucous membranes are moist.     Pharynx: Oropharynx is clear.  Eyes:     General: No scleral icterus.       Right eye: No discharge.        Left eye: No discharge.     Extraocular Movements: Extraocular movements  intact.     Conjunctiva/sclera: Conjunctivae normal.     Pupils: Pupils are equal, round, and reactive to light.  Neck:     Vascular: No JVD.  Cardiovascular:     Rate and Rhythm: Normal rate and regular rhythm.     Pulses: Normal pulses.          Radial pulses are 2+ on the right side and 2+ on the left side.     Heart sounds: Normal heart sounds.  Pulmonary:     Comments: Lungs clear to auscultation in all fields. Symmetric chest rise. No wheezing, rales, or rhonchi. Abdominal:     Comments: Abdomen is soft, non-distended, and non-tender in all quadrants. No rigidity, no guarding. No peritoneal signs.  Musculoskeletal:        General: Normal range of motion.     Cervical back: Normal range of motion.  Skin:    General: Skin is warm and dry.     Capillary Refill: Capillary refill takes less than 2 seconds.  Neurological:     Mental Status: He is oriented to person, place, and time.     GCS: GCS eye subscore is 4. GCS verbal subscore is 5. GCS motor subscore is 6.     Comments: Fluent speech, no facial droop.  Psychiatric:        Mood and Affect: Mood is anxious.        Behavior: Behavior normal.       ED Results / Procedures / Treatments   Labs (all labs ordered are listed, but only abnormal results are displayed) Labs Reviewed  URINALYSIS, ROUTINE W REFLEX MICROSCOPIC - Abnormal; Notable for the following components:      Result Value   Ketones, ur 20 (*)    Protein, ur 30 (*)    Bacteria, UA RARE (*)    All other components within normal limits  BASIC METABOLIC PANEL  CBC WITH DIFFERENTIAL/PLATELET  MAGNESIUM  TSH  RAPID URINE DRUG SCREEN, HOSP PERFORMED    EKG EKG Interpretation  Date/Time:  Tuesday October 26 2019 15:47:18 EDT Ventricular Rate:  59 PR Interval:    QRS Duration: 87 QT Interval:  394 QTC Calculation: 391 R Axis:   80 Text Interpretation: Sinus rhythm Low voltage, precordial leads 12 Lead; [place] Confirmed by Milton Ferguson [phone]) on  10/26/2019 4:56:36 PM   Radiology No results found.  Procedures Procedures (including critical care time)  Medications Ordered in ED Medications  LORazepam (ATIVAN) tablet 1 mg (1 mg Oral Given 10/26/19 1704)    ED Course  I have reviewed the triage vital signs and the  nursing notes.  Pertinent labs & imaging results that were available during my care of the patient were reviewed by me and considered in my medical decision making (see chart for details).    MDM Rules/Calculators/A&P                      Patient seen and examined. Patient presents awake, alert, hemodynamically stable, afebrile, non toxic. Patient appears anxious.  Exam is overall unremarkable.  I viewed patient's lab work from 3/25/121.  CBC, BMP, troponin were all unremarkable.  X-ray was negative for any signs of infectious processes.  EKG showed sinus rhythm. As patient was symptomatic again today will check labs, electrolytes, and EKG. He denies any chest pain, will not repeat chest xray or troponin.   Labs today show no leukocytosis, no anemia.  No severe electrolyte derangement, no renal insufficiency, normal anion gap.  EKG without ischemic changes.  UDS is negative.  UA without signs of infection but does suggest possible dehydration with 20 ketones.  He is tolerating p.o. intake here in the ED.  Encouraged that he increase his p.o. intake over the next few days as he admits to have decreased fluid intake over the last 2 weeks.    The patient appears reasonably screened and/or stabilized for discharge and I doubt any other medical condition or other [facility] requiring further screening, evaluation, or treatment in the ED at this time prior to discharge. The patient is safe for discharge with strict return precautions discussed.  Recommend patient keep his PCP appointment scheduled for tomorrow to use as a follow-up appointment.  Portions of this note were generated with Scientist, clinical (histocompatibility and immunogenetics). Dictation errors may  occur despite best attempts at proofreading.  Final Clinical Impression(s) / ED Diagnoses Final diagnoses:  Palpitations  Anxiety    Rx / DC Orders ED Discharge Orders    None       Kathyrn Lass 10/26/19 1740    Bethann Berkshire, MD 10/26/19 2301

## 2019-10-26 NOTE — ED Triage Notes (Addendum)
Per EMS- Patient reports that he had a panic attack today and states he was seen for the same 5 days ago.  During triage, patient sated his panic attack has ended and he feels fine now.

## 2019-11-12 ENCOUNTER — Other Ambulatory Visit: Payer: Self-pay

## 2019-11-12 ENCOUNTER — Encounter (HOSPITAL_BASED_OUTPATIENT_CLINIC_OR_DEPARTMENT_OTHER): Payer: Self-pay | Admitting: *Deleted

## 2019-11-12 ENCOUNTER — Emergency Department (HOSPITAL_BASED_OUTPATIENT_CLINIC_OR_DEPARTMENT_OTHER)
Admission: EM | Admit: 2019-11-12 | Discharge: 2019-11-12 | Disposition: A | Discharge status: Home / Self Care | Payer: BC Managed Care – PPO | Attending: Emergency Medicine | Admitting: Emergency Medicine

## 2019-11-12 ENCOUNTER — Emergency Department (HOSPITAL_BASED_OUTPATIENT_CLINIC_OR_DEPARTMENT_OTHER): Payer: BC Managed Care – PPO

## 2019-11-12 DIAGNOSIS — Y92410 Unspecified street and highway as the place of occurrence of the external cause: Secondary | ICD-10-CM | POA: Insufficient documentation

## 2019-11-12 DIAGNOSIS — Y999 Unspecified external cause status: Secondary | ICD-10-CM | POA: Diagnosis not present

## 2019-11-12 DIAGNOSIS — Y9389 Activity, other specified: Secondary | ICD-10-CM | POA: Insufficient documentation

## 2019-11-12 DIAGNOSIS — R0602 Shortness of breath: Secondary | ICD-10-CM | POA: Insufficient documentation

## 2019-11-12 DIAGNOSIS — S161XXA Strain of muscle, fascia and tendon at neck level, initial encounter: Secondary | ICD-10-CM | POA: Insufficient documentation

## 2019-11-12 DIAGNOSIS — Z79899 Other long term (current) drug therapy: Secondary | ICD-10-CM | POA: Diagnosis not present

## 2019-11-12 DIAGNOSIS — R0789 Other chest pain: Secondary | ICD-10-CM | POA: Diagnosis not present

## 2019-11-12 DIAGNOSIS — S199XXA Unspecified injury of neck, initial encounter: Secondary | ICD-10-CM | POA: Diagnosis present

## 2019-11-12 DIAGNOSIS — S61210A Laceration without foreign body of right index finger without damage to nail, initial encounter: Secondary | ICD-10-CM | POA: Insufficient documentation
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
## 2019-11-12 NOTE — ED Notes (Signed)
While walking pt from triage to tx room he states "can you let someone know I am starting to have chest pain and SOB". EKG ordered.

## 2019-11-12 NOTE — Discharge Instructions (Signed)
Apply ice to sore areas for 20-30 minutes at a time, 3-4 times a day.  Apply antibiotic ointment to the laceration on your finger, and keep it covered with a band-aid.  Take ibuprofen and/or acetaminophen as needed for pain.

## 2019-11-19 MED ORDER — SODIUM CHLORIDE (PF) 0.9 % IJ SOLN
INTRAMUSCULAR | Status: AC
  Filled 2019-11-19: qty 50

## 2020-01-11 ENCOUNTER — Emergency Department (HOSPITAL_BASED_OUTPATIENT_CLINIC_OR_DEPARTMENT_OTHER)
Admission: EM | Admit: 2020-01-11 | Discharge: 2020-01-11 | Disposition: A | Discharge status: Home / Self Care | Payer: BC Managed Care – PPO | Attending: Emergency Medicine | Admitting: Emergency Medicine

## 2020-01-11 ENCOUNTER — Emergency Department (HOSPITAL_BASED_OUTPATIENT_CLINIC_OR_DEPARTMENT_OTHER): Payer: BC Managed Care – PPO

## 2020-01-11 ENCOUNTER — Other Ambulatory Visit: Payer: Self-pay

## 2020-01-11 ENCOUNTER — Encounter (HOSPITAL_BASED_OUTPATIENT_CLINIC_OR_DEPARTMENT_OTHER): Payer: Self-pay

## 2020-01-11 DIAGNOSIS — R0600 Dyspnea, unspecified: Secondary | ICD-10-CM | POA: Diagnosis not present

## 2020-01-11 DIAGNOSIS — R05 Cough: Secondary | ICD-10-CM | POA: Insufficient documentation

## 2020-01-11 DIAGNOSIS — R5383 Other fatigue: Secondary | ICD-10-CM | POA: Insufficient documentation

## 2020-01-11 DIAGNOSIS — R059 Cough, unspecified: Secondary | ICD-10-CM

## 2020-01-11 DIAGNOSIS — R002 Palpitations: Secondary | ICD-10-CM | POA: Diagnosis not present

## 2020-01-11 DIAGNOSIS — R0602 Shortness of breath: Secondary | ICD-10-CM | POA: Diagnosis present

## 2020-01-11 NOTE — ED Triage Notes (Signed)
Pt c/o ShOB, productive cough, chest tightness, fatigue x 2 weeks suddenly became worse yesterday.

## 2020-01-11 NOTE — ED Provider Notes (Signed)
[facility] EMERGENCY DEPARTMENT Provider Note   CSN: [id number] Arrival date & time: 01/11/20  2003     History Chief Complaint  Patient presents with  . Shortness of Breath    Jesse Mckinney is a 34 y.o. male.  HPI Patient presents with shortness of breath.  Has had over the last couple weeks but states he has been coughing for a little longer than that.  States he is productive of some gray sputum.  No fevers.  States he has had a little more fatigue with exertion.  Had Covid around 6 months ago.  Does not smoke.  No with long history.  States he does have a history of anxiety.  States he has cardiology follow-up tomorrow for palpitation studies been feeling.  No fevers.  No hemoptysis.  No swelling in his legs.  No chest pain.    Past Medical History:  Diagnosis Date  . Anxiety   . GERD (gastroesophageal reflux disease)   . Panic attack     There are no problems to display for this patient.   Past Surgical History:  Procedure Laterality Date  . KNEE SURGERY         Family History  Problem Relation Age of Onset  . Healthy Mother   . Healthy Father     Social History   Tobacco Use  . Smoking status: Never Smoker  . Smokeless tobacco: Never Used  Vaping Use  . Vaping Use: Never used  Substance Use Topics  . Alcohol use: Not Currently    Comment: occ  . Drug use: Never    Home Medications Prior to Admission medications   Medication Sig Start Date End Date Taking? Authorizing Provider  busPIRone (BUSPAR) 10 MG tablet Take 1 tablet (10 mg total) by mouth 3 (three) times daily. 11/24/18   Raylene Everts, MD  Esomeprazole Magnesium (NEXIUM PO) Take by mouth.    [provider]  Multiple Vitamin (MULTIVITAMIN WITH MINERALS) TABS tablet Take 1 tablet by mouth daily.    [provider]  pantoprazole (PROTONIX) 40 MG tablet Take 1 tablet (40 mg total) by mouth daily. 04/20/19   Veryl Speak, MD  Vitamin D, Ergocalciferol,  (DRISDOL) 1.25 MG (50000 UT) CAPS capsule Take 50,000 Units by mouth once a week. Wednesday 02/12/19   [provider]    Allergies    Iodinated diagnostic agents, Iodine, and Mushroom extract complex  Review of Systems   Review of Systems  Constitutional: Negative for appetite change and fever.  HENT: Negative for congestion.   Respiratory: Positive for cough and shortness of breath. Negative for wheezing.   Cardiovascular: Positive for palpitations. Negative for chest pain.  Gastrointestinal: Negative for abdominal pain.  Genitourinary: Negative for flank pain.  Musculoskeletal: Negative for back pain.  Skin: Negative for rash.  Neurological: Negative for weakness.  Psychiatric/Behavioral: Negative for confusion.    Physical Exam Updated Vital Signs BP (!) 107/56 (BP Location: Left Arm)   Pulse 64   Temp 98.8 F (37.1 C) (Oral)   Resp 18   Ht 6\' 6"  (1.981 m)   Wt (!) 190.5 kg   SpO2 100%   BMI 48.54 kg/m   Physical Exam Vitals and nursing note reviewed.  HENT:     Head: Atraumatic.  Cardiovascular:     Rate and Rhythm: Normal rate and regular rhythm.  Pulmonary:     Breath sounds: Normal breath sounds. No wheezing, rhonchi or rales.     Comments: Patient  is having occasional cough that appears nonproductive. Chest:     Chest wall: No tenderness.  Musculoskeletal:     Right lower leg: No tenderness. No edema.     Left lower leg: No tenderness. No edema.  Skin:    General: Skin is warm.     Capillary Refill: Capillary refill takes less than 2 seconds.  Neurological:     Mental Status: He is alert.     ED Results / Procedures / Treatments   Labs (all labs ordered are listed, but only abnormal results are displayed) Labs Reviewed - No data to display  EKG EKG Interpretation  Date/Time:  Tuesday January 11 2020 20:05:40 EDT Ventricular Rate:  70 PR Interval:  164 QRS Duration: 90 QT Interval:  392 QTC Calculation: 423 R Axis:   68 Text  Interpretation: Normal sinus rhythm Normal ECG Confirmed by Benjiman Core [phone]) on 01/11/2020 9:36:16 PM   Radiology DG Chest 2 View  Result Date: 01/11/2020 CLINICAL DATA:  Cough for weeks EXAM: CHEST - 2 VIEW COMPARISON:  None. FINDINGS: The heart size and mediastinal contours are within normal limits. Both lungs are clear. The visualized skeletal structures are unremarkable. IMPRESSION: No active cardiopulmonary disease. Electronically Signed   By: Jonna Clark M.D.   On: 01/11/2020 22:17    Procedures Procedures (including critical care time)  Medications Ordered in ED Medications - No data to display  ED Course  I have reviewed the triage vital signs and the nursing notes.  Pertinent labs & imaging results that were available during my care of the patient were reviewed by me and considered in my medical decision making (see chart for details).    MDM Rules/Calculators/A&P                         Patient with cough.  X-ray reassuring.  Reportedly productive of gray sputum but no hemoptysis.  Had Covid around 6 months ago.  Has had mild fatigue.  Good color.  Doubt severe anemia.  Has follow-up with cardiology for palpitations tomorrow.  Doubt PE.  Will discharge home. Final Clinical Impression(s) / ED Diagnoses Final diagnoses:  Dyspnea, unspecified type  Cough    Rx / DC Orders ED Discharge Orders    None       Benjiman Core, MD 01/11/20 2316

## 2020-01-12 ENCOUNTER — Other Ambulatory Visit: Payer: Self-pay

## 2020-01-12 ENCOUNTER — Encounter (HOSPITAL_BASED_OUTPATIENT_CLINIC_OR_DEPARTMENT_OTHER): Payer: Self-pay | Admitting: Emergency Medicine

## 2020-01-12 ENCOUNTER — Emergency Department (HOSPITAL_BASED_OUTPATIENT_CLINIC_OR_DEPARTMENT_OTHER)
Admission: EM | Admit: 2020-01-12 | Discharge: 2020-01-12 | Disposition: A | Discharge status: Home / Self Care | Payer: BC Managed Care – PPO | Attending: Emergency Medicine | Admitting: Emergency Medicine

## 2020-01-12 DIAGNOSIS — E876 Hypokalemia: Secondary | ICD-10-CM | POA: Diagnosis not present

## 2020-01-12 DIAGNOSIS — R002 Palpitations: Secondary | ICD-10-CM | POA: Insufficient documentation

## 2020-01-12 LAB — CBC WITH DIFFERENTIAL/PLATELET
Abs Immature Granulocytes: 0.03 10*3/uL (ref 0.00–0.07)
Basophils Absolute: 0 10*3/uL (ref 0.0–0.1)
Basophils Relative: 0 %
Eosinophils Absolute: 0.1 10*3/uL (ref 0.0–0.5)
Eosinophils Relative: 1 %
HCT: 44 % (ref 39.0–52.0)
Hemoglobin: 13.8 g/dL (ref 13.0–17.0)
Immature Granulocytes: 0 %
Lymphocytes Relative: 25 %
Lymphs Abs: 2 10*3/uL (ref 0.7–4.0)
MCH: 26.6 pg (ref 26.0–34.0)
MCHC: 31.4 g/dL (ref 30.0–36.0)
MCV: 84.8 fL (ref 80.0–100.0)
Monocytes Absolute: 0.5 10*3/uL (ref 0.1–1.0)
Monocytes Relative: 6 %
Neutro Abs: 5.3 10*3/uL (ref 1.7–7.7)
Neutrophils Relative %: 68 %
Platelets: 203 10*3/uL (ref 150–400)
RBC: 5.19 MIL/uL (ref 4.22–5.81)
RDW: 13.7 % (ref 11.5–15.5)
WBC: 8 10*3/uL (ref 4.0–10.5)
nRBC: 0 % (ref 0.0–0.2)

## 2020-01-12 LAB — BASIC METABOLIC PANEL
Anion gap: 9 (ref 5–15)
BUN: 15 mg/dL (ref 6–20)
CO2: 24 mmol/L (ref 22–32)
Calcium: 8.9 mg/dL (ref 8.9–10.3)
Chloride: 105 mmol/L (ref 98–111)
Creatinine, Ser: 1.04 mg/dL (ref 0.61–1.24)
GFR calc Af Amer: >60 mL/min (ref >60)
GFR calc non Af Amer: >60 mL/min (ref >60)
Glucose, Bld: 134 mg/dL — ABNORMAL HIGH (ref 70–99)
Potassium: 3.3 mmol/L — ABNORMAL LOW (ref 3.5–5.1)
Sodium: 138 mmol/L (ref 135–145)

## 2020-01-12 LAB — MAGNESIUM: Magnesium: 2.1 mg/dL (ref 1.7–2.4)

## 2020-01-12 MED ORDER — POTASSIUM CHLORIDE CRYS ER 20 MEQ PO TBCR
40.0000 meq | EXTENDED_RELEASE_TABLET | Freq: Once | ORAL | Status: AC
  Administered 2020-01-12: 40 meq via ORAL
  Filled 2020-01-12: qty 2

## 2020-01-12 MED ORDER — HYDROXYZINE HCL 25 MG PO TABS
25.0000 mg | ORAL_TABLET | Freq: Four times a day (QID) | ORAL | 0 refills | Status: DC | PRN
Start: 2020-01-12 — End: 2020-05-04

## 2020-01-12 MED ORDER — HYDROXYZINE HCL 25 MG PO TABS
25.0000 mg | ORAL_TABLET | Freq: Once | ORAL | Status: AC
  Administered 2020-01-12: 25 mg via ORAL
  Filled 2020-01-12: qty 1

## 2020-01-12 NOTE — ED Triage Notes (Signed)
Pt here after having palpitations while driving to work. No chest pain, but tightness in chest. No SOB or dizziness.

## 2020-01-12 NOTE — ED Provider Notes (Signed)
[facility] EMERGENCY DEPARTMENT Provider Note   CSN: [id number] Arrival date & time: 01/12/20  1520     History Chief Complaint  Patient presents with  . Palpitations    Jesse Mckinney is a 34 y.o. male.  Jesse Mckinney is a 34 y.o. male with history of GERD, anxiety and panic attacks, who presents to the ED for evaluation of palpitations.  Patient reports that this afternoon he was driving to work.  As he was in his car he started to feel full and felt like he may need to belch.  He tried to belch and then started developing palpitations and heart racing sensation.  He reports he started to feel chest tightness and some shortness of breath but no chest pain.  Reports he has been dealing with intermittent episodes of similar palpitations and shortness of breath.  He was seen in the ED yesterday for shortness of breath and had reassuring chest x-ray and EKG.  He reports he has had some occasional cough with gray sputum and noticed some gray dust on the air ducts in his house and was worried this was contributing.  He has not had any hemoptysis.  He reports that he saw a cardiologist today regarding his palpitations, and is scheduled for an echo and a Holter monitor next week.  He has had similar episodes intermittently over the last few weeks.  And reports that he has persistent episodes where he feels short of breath suddenly.  He saw cardiologist initially who thought that he was having PACs and PVCs and he went to see a second cardiologist for another opinion.  He reports that his PCP has only prescribed him BuSpar for his anxiety which she feels makes things worse rather than better.  He reports since arriving in the emergency department his symptoms started to improve and he reports no current palpitations or heart racing sensation.        Past Medical History:  Diagnosis Date  . Anxiety   . GERD (gastroesophageal reflux disease)   . Panic attack     There are no  problems to display for this patient.   Past Surgical History:  Procedure Laterality Date  . KNEE SURGERY         Family History  Problem Relation Age of Onset  . Healthy Mother   . Healthy Father     Social History   Tobacco Use  . Smoking status: Never Smoker  . Smokeless tobacco: Never Used  Vaping Use  . Vaping Use: Never used  Substance Use Topics  . Alcohol use: Not Currently    Comment: occ  . Drug use: Never    Home Medications Prior to Admission medications   Medication Sig Start Date End Date Taking? Authorizing Provider  busPIRone (BUSPAR) 10 MG tablet Take 1 tablet (10 mg total) by mouth 3 (three) times daily. 11/24/18   Eustace Moore, MD  Esomeprazole Magnesium (NEXIUM PO) Take by mouth.    [provider]  hydrOXYzine (ATARAX/VISTARIL) 25 MG tablet Take 1 tablet (25 mg total) by mouth every 6 (six) hours as needed. 01/12/20   Dartha Lodge, PA-C  Multiple Vitamin (MULTIVITAMIN WITH MINERALS) TABS tablet Take 1 tablet by mouth daily.    [provider]  pantoprazole (PROTONIX) 40 MG tablet Take 1 tablet (40 mg total) by mouth daily. 04/20/19   Geoffery Lyons, MD  Vitamin D, Ergocalciferol, (DRISDOL) 1.25 MG (50000 UT) CAPS capsule Take 50,000 Units by mouth once  a week. Wednesday 02/12/19   [provider]    Allergies    Iodinated diagnostic agents, Iodine, and Mushroom extract complex  Review of Systems   Review of Systems  Constitutional: Negative for chills and fever.  HENT: Negative.   Eyes: Negative for visual disturbance.  Respiratory: Positive for chest tightness and shortness of breath. Negative for cough.   Cardiovascular: Positive for palpitations. Negative for chest pain and leg swelling.  Gastrointestinal: Negative for abdominal pain, nausea and vomiting.  Musculoskeletal: Negative for arthralgias and myalgias.  Neurological: Negative for syncope, weakness, light-headedness, numbness and headaches.    Psychiatric/Behavioral: The patient is nervous/anxious.   All other systems reviewed and are negative.   Physical Exam Updated Vital Signs BP 137/66   Pulse 87   Temp 98.2 F (36.8 C) (Oral)   Resp (!) 21   Ht 6\' 6"  (1.981 m)   Wt (!) 198.2 kg   SpO2 99%   BMI 50.50 kg/m   Physical Exam Vitals and nursing note reviewed.  Constitutional:      General: He is not in acute distress.    Appearance: Normal appearance. He is well-developed. He is obese. He is not diaphoretic.     Comments: Appears anxious but well and in no acute distress  HENT:     Head: Normocephalic and atraumatic.     Mouth/Throat:     Mouth: Mucous membranes are moist.     Pharynx: Oropharynx is clear.  Eyes:     General:        Right eye: No discharge.        Left eye: No discharge.  Cardiovascular:     Rate and Rhythm: Normal rate and regular rhythm.     Pulses: Normal pulses.     Heart sounds: Normal heart sounds. No murmur heard.  No friction rub. No gallop.   Pulmonary:     Effort: Pulmonary effort is normal. No respiratory distress.     Breath sounds: Normal breath sounds. No wheezing or rales.     Comments: Respirations equal and unlabored, patient able to speak in full sentences, lungs clear to auscultation bilaterally Abdominal:     General: Bowel sounds are normal. There is no distension.     Palpations: Abdomen is soft. There is no mass.     Tenderness: There is no abdominal tenderness. There is no guarding.  Musculoskeletal:        General: No deformity.     Cervical back: Neck supple.     Right lower leg: No edema.     Left lower leg: No edema.  Skin:    General: Skin is warm and dry.     Capillary Refill: Capillary refill takes less than 2 seconds.  Neurological:     Mental Status: He is alert.     Coordination: Coordination normal.     Comments: Speech is clear, able to follow commands Moves extremities without ataxia, coordination intact     ED Results / Procedures /  Treatments   Labs (all labs ordered are listed, but only abnormal results are displayed) Labs Reviewed  BASIC METABOLIC PANEL - Abnormal; Notable for the following components:      Result Value   Potassium 3.3 (*)    Glucose, Bld 134 (*)    All other components within normal limits  CBC WITH DIFFERENTIAL/PLATELET  MAGNESIUM    EKG EKG Interpretation  Date/Time:  Wednesday January 12 2020 15:25:54 EDT Ventricular Rate:  78 PR Interval:  QRS Duration: 91 QT Interval:  379 QTC Calculation: 432 R Axis:   66 Text Interpretation: Sinus rhythm When compared to prior, no significant cahnges seen. NO STEMI Confirmed by Antony Blackbird [phone]) on 01/12/2020 4:32:10 PM   Radiology DG Chest 2 View  Result Date: 01/11/2020 CLINICAL DATA:  Cough for weeks EXAM: CHEST - 2 VIEW COMPARISON:  None. FINDINGS: The heart size and mediastinal contours are within normal limits. Both lungs are clear. The visualized skeletal structures are unremarkable. IMPRESSION: No active cardiopulmonary disease. Electronically Signed   By: Prudencio Pair M.D.   On: 01/11/2020 22:17    Procedures Procedures (including critical care time)  Medications Ordered in ED Medications  hydrOXYzine (ATARAX/VISTARIL) tablet 25 mg (25 mg Oral Given 01/12/20 1606)  potassium chloride SA (KLOR-CON) CR tablet 40 mEq (40 mEq Oral Given 01/12/20 1649)    ED Course  I have reviewed the triage vital signs and the nursing notes.  Pertinent labs & imaging results that were available during my care of the patient were reviewed by me and considered in my medical decision making (see chart for details).    MDM Rules/Calculators/A&P                           34 year old male presents with episode of palpitations today while he was driving to work, he has had multiple episodes of palpitations as well as shortness of breath and chest tightness recently, was seen in the ED yesterday for shortness of breath and had normal chest x-ray and  EKG.  Upon arriving to the ED he reports his palpitations are improving and on arrival he has an EKG with sinus rhythm with no significant changes.  He has been on chronic reactive monitoring throughout his ED stay with no evidence of arrhythmia or tachycardia.  He reports increasing episodes of this recently and he is not sure if this is related to his anxiety, but when he started to feel these palpitations today he thinks he had a panic attack while in the car.  He did see a cardiologist today regarding his palpitations, and they scheduled him for an outpatient echo and Holter monitoring which are going to be done next week. Basic labs done today which show mild hypokalemia of 3.3, p.o. potassium replacement given, no other significant electrolyte derangements, normal mag, normal blood counts.  On reevaluation after patient received hydroxyzine for anxiety he states he is feeling better.  Patient has appropriate outpatient follow-up for his palpitations and vitals have remained stable here in the ED.  He is only been placed on BuSpar for anxiety, feel he would benefit from additional medications for anxiety, I have encouraged him to discuss with PCP or establish care with a new PCP.  Will prescribe hydroxyzine as needed for severe symptoms.   At this time there does not appear to be any evidence of an acute emergency medical condition and the patient appears stable for discharge with appropriate outpatient follow up.Diagnosis was discussed with patient who verbalizes understanding and is agreeable to discharge.   Final Clinical Impression(s) / ED Diagnoses Final diagnoses:  Palpitations  Hypokalemia    Rx / DC Orders ED Discharge Orders         Ordered    hydrOXYzine (ATARAX/VISTARIL) 25 MG tablet  Every 6 hours PRN     Discontinue  Reprint     01/12/20 1754           Benedetto Goad  N, PA-C 01/12/20 1844    Tegeler, Canary Brim, MD 01/12/20 [id number]

## 2020-01-12 NOTE — Discharge Instructions (Signed)
Your EKG and lab work overall look good today.  Your potassium was a bit low and you were given potassium replacement, make sure you are getting enough potassium in your diet.  Continue with follow-up as planned with cardiologist for echo and Holter monitor.  If you would like to establish care with a new primary doctor you can call the phone number at the end of your paperwork today.  You may also benefit from further management of your anxiety by a psychiatrist.  In the meantime if you feel that your anxiety is worsening you can use hydroxyzine as needed.

## 2020-01-12 NOTE — ED Notes (Signed)
2 unsuccessful IV attempts.

## 2020-05-04 ENCOUNTER — Encounter (HOSPITAL_COMMUNITY): Payer: Self-pay | Admitting: *Deleted

## 2020-05-04 ENCOUNTER — Emergency Department (HOSPITAL_COMMUNITY)
Admission: EM | Admit: 2020-05-04 | Discharge: 2020-05-04 | Disposition: A | Discharge status: Home / Self Care | Payer: Self-pay | Attending: Emergency Medicine | Admitting: Emergency Medicine

## 2020-05-04 ENCOUNTER — Other Ambulatory Visit: Payer: Self-pay

## 2020-05-04 ENCOUNTER — Emergency Department (HOSPITAL_COMMUNITY): Payer: Self-pay

## 2020-05-04 DIAGNOSIS — R002 Palpitations: Secondary | ICD-10-CM | POA: Insufficient documentation

## 2020-05-04 DIAGNOSIS — M545 Low back pain, unspecified: Secondary | ICD-10-CM | POA: Insufficient documentation

## 2020-05-04 LAB — CBC WITH DIFFERENTIAL/PLATELET
Abs Immature Granulocytes: 0.05 10*3/uL (ref 0.00–0.07)
Basophils Absolute: 0 10*3/uL (ref 0.0–0.1)
Basophils Relative: 0 %
Eosinophils Absolute: 0.1 10*3/uL (ref 0.0–0.5)
Eosinophils Relative: 1 %
HCT: 45.3 % (ref 39.0–52.0)
Hemoglobin: 14 g/dL (ref 13.0–17.0)
Immature Granulocytes: 1 %
Lymphocytes Relative: 25 %
Lymphs Abs: 1.8 10*3/uL (ref 0.7–4.0)
MCH: 26.3 pg (ref 26.0–34.0)
MCHC: 30.9 g/dL (ref 30.0–36.0)
MCV: 85 fL (ref 80.0–100.0)
Monocytes Absolute: 0.6 10*3/uL (ref 0.1–1.0)
Monocytes Relative: 9 %
Neutro Abs: 4.6 10*3/uL (ref 1.7–7.7)
Neutrophils Relative %: 64 %
Platelets: 167 10*3/uL (ref 150–400)
RBC: 5.33 MIL/uL (ref 4.22–5.81)
RDW: 13.6 % (ref 11.5–15.5)
WBC: 7.2 10*3/uL (ref 4.0–10.5)
nRBC: 0 % (ref 0.0–0.2)

## 2020-05-04 LAB — URINALYSIS, ROUTINE W REFLEX MICROSCOPIC
Bilirubin Urine: NEGATIVE
Glucose, UA: NEGATIVE mg/dL
Hgb urine dipstick: NEGATIVE
Ketones, ur: NEGATIVE mg/dL
Leukocytes,Ua: NEGATIVE
Nitrite: NEGATIVE
Protein, ur: NEGATIVE mg/dL
Specific Gravity, Urine: 1.027 (ref 1.005–1.030)
pH: 5 (ref 5.0–8.0)

## 2020-05-04 LAB — BASIC METABOLIC PANEL
Anion gap: 9 (ref 5–15)
BUN: 17 mg/dL (ref 6–20)
CO2: 23 mmol/L (ref 22–32)
Calcium: 9 mg/dL (ref 8.9–10.3)
Chloride: 108 mmol/L (ref 98–111)
Creatinine, Ser: 0.95 mg/dL (ref 0.61–1.24)
GFR calc non Af Amer: >60 mL/min (ref >60)
Glucose, Bld: 96 mg/dL (ref 70–99)
Potassium: 4 mmol/L (ref 3.5–5.1)
Sodium: 140 mmol/L (ref 135–145)

## 2020-05-04 LAB — MAGNESIUM: Magnesium: 2.1 mg/dL (ref 1.7–2.4)

## 2020-05-04 MED ORDER — METHOCARBAMOL 500 MG PO TABS
500.0000 mg | ORAL_TABLET | Freq: Two times a day (BID) | ORAL | 0 refills | Status: DC
Start: 2020-05-04 — End: 2020-07-12

## 2020-05-04 MED ORDER — NAPROXEN 500 MG PO TABS
500.0000 mg | ORAL_TABLET | Freq: Two times a day (BID) | ORAL | 0 refills | Status: DC
Start: 2020-05-04 — End: 2020-07-12

## 2020-05-04 MED ORDER — HYDROXYZINE HCL 25 MG PO TABS
25.0000 mg | ORAL_TABLET | Freq: Four times a day (QID) | ORAL | 0 refills | Status: DC
Start: 2020-05-04 — End: 2020-07-12

## 2020-05-04 MED ORDER — LIDOCAINE 5 % EX PTCH: 1.0000 | MEDICATED_PATCH | CUTANEOUS | 0 refills | Status: DC

## 2020-05-04 NOTE — Discharge Instructions (Addendum)
Take the Hydroxyzine for palpitations, Follow up with Cardiology and Primary care provider  Take the Robaxin a muscle relaxer for your back pain. Can also add Lidoderm patches. Follow up with Orthopedics if your back pain is unresolved.  Return for new or worsening symptoms

## 2020-05-04 NOTE — ED Notes (Signed)
Went in patients room to discharge patient and he wasn't in the room  Didn't get discharge intructions

## 2020-05-04 NOTE — ED Triage Notes (Signed)
Patient presents to ed via [facility] from home with c/o palpitations states he has woke up the past 3 night with a "weird senstation in his chest". States he has a history of anxiety denies chest pain at present, c/o lower back pain . States  He was seen at cards  Several  Months ago however didn't follow up .

## 2020-05-04 NOTE — ED Provider Notes (Signed)
[facility] EMERGENCY DEPARTMENT Provider Note   CSN: [id number] Arrival date & time: 05/04/20  6294     History Palpitations   Jesse Mckinney is a 34 y.o. male with past medical history significant for GERD, anxiety who presents for evaluation of palpitations.  Patient states over the last 3 nights before he gets into bed his heart start racing.  He subsequently has palpitations.  These lacks approximately 5 to 10 minutes and self resolved.  States when he has these palpitations he feels like "weird" sensation in his chest however denies any pain.  Does not radiate to his left arm, left jaw or back.  No associated diaphoresis, nausea, vomiting or shortness of breath.  He is not taking any medications.  Was previously seen by [facility] cardiology in June had Holter monitor which showed episodes of sinus tachycardia however patient has not followed up with cardiology.  Patient states his symptoms only occur at night prior to bed.  Patient also states he has had left lower back pain x2 weeks.  No recent injury or trauma.  Denies prior imaging.  Pain does not radiate.  Denies fever, chills, nausea, vomiting, hemoptysis, abdominal pain, flank pain, hematuria, dysuria polyuria, rashes, lesions, unilateral leg swelling, redness or warmth.  Has not take anything for symptoms.  Denies additional aggravating or alleviating factors.  No history IV drug use, bowel or bladder incontinence, saddle paresthesias.  History obtained from patient and past medical records. No interpretor was used.  HPI     Past Medical History:  Diagnosis Date  . Anxiety   . GERD (gastroesophageal reflux disease)   . Panic attack     There are no problems to display for this patient.   Past Surgical History:  Procedure Laterality Date  . KNEE SURGERY         Family History  Problem Relation Age of Onset  . Healthy Mother   . Healthy Father     Social History   Tobacco Use  . Smoking status:  Never Smoker  . Smokeless tobacco: Never Used  Vaping Use  . Vaping Use: Never used  Substance Use Topics  . Alcohol use: Not Currently    Comment: occ  . Drug use: Never    Home Medications Prior to Admission medications   Medication Sig Start Date End Date Taking? Authorizing Provider  busPIRone (BUSPAR) 10 MG tablet Take 1 tablet (10 mg total) by mouth 3 (three) times daily. 11/24/18  Yes Eustace Moore, MD  hydrOXYzine (ATARAX/VISTARIL) 25 MG tablet Take 1 tablet (25 mg total) by mouth every 6 (six) hours. 05/04/20   Tala Eber A, PA-C  lidocaine (LIDODERM) 5 % Place 1 patch onto the skin daily. Remove & Discard patch within 12 hours or as directed by MD 05/04/20   Daniela Hernan A, PA-C  methocarbamol (ROBAXIN) 500 MG tablet Take 1 tablet (500 mg total) by mouth 2 (two) times daily. 05/04/20   Koah Chisenhall A, PA-C  naproxen (NAPROSYN) 500 MG tablet Take 1 tablet (500 mg total) by mouth 2 (two) times daily. 05/04/20   Lewanda Perea A, PA-C  pantoprazole (PROTONIX) 40 MG tablet Take 1 tablet (40 mg total) by mouth daily. Patient not taking: Reported on 05/04/2020 04/20/19   Geoffery Lyons, MD    Allergies    Iodinated diagnostic agents, Iodine, and Mushroom extract complex  Review of Systems   Review of Systems  Constitutional: Negative.   HENT: Negative.   Respiratory: Negative.  Cardiovascular: Positive for palpitations. Negative for chest pain and leg swelling.  Gastrointestinal: Negative.   Genitourinary: Negative.   Musculoskeletal: Positive for back pain. Negative for arthralgias, gait problem, joint swelling, myalgias, neck pain and neck stiffness.  Skin: Negative.   Neurological: Negative.   All other systems reviewed and are negative.   Physical Exam Updated Vital Signs BP 123/81 (BP Location: Right Arm)   Pulse (!) 56   Temp 98.2 F (36.8 C) (Oral)   Resp 16   Ht 6\' 6"  (1.981 m)   Wt (!) 190.5 kg   SpO2 98%   BMI 48.54 kg/m   Physical  Exam Vitals and nursing note reviewed.  Constitutional:      General: He is not in acute distress.    Appearance: He is obese. He is not ill-appearing, toxic-appearing or diaphoretic.     Comments: Texting in bed on initial evaluation. No acute distress noted.  HENT:     Head: Normocephalic and atraumatic.     Jaw: There is normal jaw occlusion.     Nose: Nose normal.     Mouth/Throat:     Mouth: Mucous membranes are moist.  Eyes:     Extraocular Movements: Extraocular movements intact.  Neck:     Vascular: No carotid bruit or JVD.     Trachea: Trachea and phonation normal.     Meningeal: Brudzinski's sign and Kernig's sign absent.  Cardiovascular:     Rate and Rhythm: Normal rate.     Pulses: Normal pulses.          Radial pulses are 2+ on the right side and 2+ on the left side.       Posterior tibial pulses are 2+ on the right side and 2+ on the left side.     Heart sounds: Normal heart sounds.  Pulmonary:     Effort: Pulmonary effort is normal.     Breath sounds: Normal breath sounds and air entry.  Chest:     Chest wall: No deformity, swelling, tenderness (Soft non tender without rebound or guarding. negatuve CVA tap bilaterally.), crepitus or edema.  Abdominal:     General: Bowel sounds are normal.     Palpations: There is no mass.     Tenderness: There is no abdominal tenderness. There is no right CVA tenderness, left CVA tenderness or guarding.     Hernia: No hernia is present.  Musculoskeletal:        General: No swelling, tenderness, deformity or signs of injury. Normal range of motion.     Cervical back: Full passive range of motion without pain, normal range of motion and neck supple.       Back:     Right lower leg: No edema.     Left lower leg: No edema.     Comments: Full range of motion of the T-spine and L-spine with flexion, hyperextension, and lateral flexion. No midline tenderness or stepoffs. No tenderness to palpation of the spinous processes of the  T-spine or L-spine. Mild tenderness to palpation of the paraspinous muscles of the L-spine on LEFT. Negative straight leg raise.  Feet:     Right foot:     Skin integrity: Skin integrity normal.     Left foot:     Skin integrity: Skin integrity normal.  Skin:    General: Skin is warm.     Capillary Refill: Capillary refill takes less than 2 seconds.  Neurological:     General: No focal deficit  present.     Mental Status: He is alert and oriented to person, place, and time.     Comments: Reflex Scores:      Bicep reflexes are 2+ on the right side and 2+ on the left side.      Brachioradialis reflexes are 2+ on the right side and 2+ on the left side.      Patellar reflexes are 2+ on the right side and 2+ on the left side.      Achilles reflexes are 2+ on the right side and 2+ on the left side. Speech is clear and goal oriented, follows commands Normal 5/5 strength in upper and lower extremities bilaterally including dorsiflexion and plantar flexion, strong and equal grip strength Sensation normal to light and sharp touch Moves extremities without ataxia, coordination intact Normal gait Normal balance No Clonus     ED Results / Procedures / Treatments   Labs (all labs ordered are listed, but only abnormal results are displayed) Labs Reviewed  CBC WITH DIFFERENTIAL/PLATELET  BASIC METABOLIC PANEL  URINALYSIS, ROUTINE W REFLEX MICROSCOPIC  MAGNESIUM    EKG EKG Interpretation  Date/Time:  Thursday May 04 2020 06:36:07 EDT Ventricular Rate:  54 PR Interval:    QRS Duration: 91 QT Interval:  410 QTC Calculation: 389 R Axis:   63 Text Interpretation: Sinus rhythm No STEMI Confirmed by Alona BeneLong, Joshua [phone]([id number]) on 05/04/2020 6:38:15 AM   Radiology DG Chest 2 View  Result Date: 05/04/2020 CLINICAL DATA:  Chest pain EXAM: CHEST - 2 VIEW COMPARISON:  01/11/2020. FINDINGS: Mediastinum hilar structures normal. Heart size normal. Low lung volumes with mild bibasilar atelectasis.  No focal infiltrate. No pleural effusion or pneumothorax. IMPRESSION: Low lung volumes with mild bibasilar atelectasis.  Unremarkable. Electronically Signed   By: Maisie Fushomas  Register   On: 05/04/2020 07:35   DG Lumbar Spine Complete  Result Date: 05/04/2020 CLINICAL DATA:  Low back pain. EXAM: LUMBAR SPINE - COMPLETE 4+ VIEW COMPARISON:  07/17/2016. FINDINGS: Minimal scoliosis concave left. No acute bony abnormality identified. No evidence of fracture. Paraspinal soft tissues are unremarkable. IMPRESSION: Minimal scoliosis concave left. No acute abnormality identified. Electronically Signed   By: Maisie Fushomas  Register   On: 05/04/2020 07:37    Procedures Procedures (including critical care time)  Medications Ordered in ED Medications - No data to display  ED Course  I have reviewed the triage vital signs and the nursing notes.  Pertinent labs & imaging results that were available during my care of the patient were reviewed by me and considered in my medical decision making (see chart for details).  [age] year old presents for evaluation of palpitations. Afebrile, non septic non ill appearing. Hx of same, Previous [facility] with holter monitor showing sinus tach. Patient has not followed up outpatient. No CP, SOB, abd pain radiating in to back. No clinical evidence of DVT on exam. No tachycardia, tachypnea or hypoxia. PERC negative. No exertional or pleuritic chest pain.  Symptoms proceeded by racing thought and only occur at night. No PND, orthopnea, No hx of sleep apnea.  Patient also with back pain to left paraspinal lumbar region without radiation.  No recent injury or trauma.  I am able to reproduce his pain on exam and pain worse with movement. He has negative straight leg raise.  He is neurovascularly intact.  Symptoms seem musculoskeletal in etiology.  I low suspicion for acute neurosurgical emergency such as cauda equina, discitis, osteomyelitis, transverse myelitis, psoas abscess.  Plan  on labs, imaging and reassess  Labs and imaging personally reviewed and interpreted:  DG chest with low lung volumes, no infiltrates, cardiomegaly [place] edema, pneumothorax DG lumbar with scoliosis. CBC without leukocytosis BMP without electrolyte renal or liver abnoramlity Mag 2.1 UA negative for infection, no hematuria  Patient reassessed. No current palpitations, CP, SOB. Symptoms seem non CP in nature, seem anxiety related with preceding racing thoughts. Encouraged to followed up with PCP. Rx of hydroxyzine. No current pain. Ambulatory without difficulty. NV intact. Work up reassuring. Low suspicion for ACS, PE, dissection, AAA, electrolyte abnormality as cause of palpitations. Will have patient follow up with Ortho for back pain if unresolved. Do onto feel he needs further emergent workup at this time for his back pain.  The patient has been appropriately medically screened and/or stabilized in the ED. I have low suspicion for any other emergent medical condition which would require further screening, evaluation or treatment in the ED or require inpatient management.  Patient is hemodynamically stable and in no acute distress.  Patient able to ambulate in department prior to ED.  Evaluation does not show acute pathology that would require ongoing or additional emergent interventions while in the emergency department or further inpatient treatment.  I have discussed the diagnosis with the patient and answered all questions.  Pain is been managed while in the emergency department and patient has no further complaints prior to discharge.  Patient is comfortable with plan discussed in room and is stable for discharge at this time.  I have discussed strict return precautions for returning to the emergency department.  Patient was encouraged to follow-up with PCP/specialist refer to at discharge.    MDM Rules/Calculators/A&P                           Final Clinical Impression(s) / ED  Diagnoses Final diagnoses:  Palpitations  Acute left-sided low back pain without sciatica    Rx / DC Orders ED Discharge Orders         Ordered    hydrOXYzine (ATARAX/VISTARIL) 25 MG tablet  Every 6 hours        05/04/20 0855    methocarbamol (ROBAXIN) 500 MG tablet  2 times daily        05/04/20 0855    lidocaine (LIDODERM) 5 %  Every 24 hours        05/04/20 0855    naproxen (NAPROSYN) 500 MG tablet  2 times daily        05/04/20 0855           Jhoel Stieg A, PA-C 05/04/20 0900    Margarita Grizzle, MD 05/05/20 1011

## 2020-05-16 ENCOUNTER — Encounter (HOSPITAL_BASED_OUTPATIENT_CLINIC_OR_DEPARTMENT_OTHER): Payer: Self-pay | Admitting: Emergency Medicine

## 2020-05-16 ENCOUNTER — Emergency Department (HOSPITAL_BASED_OUTPATIENT_CLINIC_OR_DEPARTMENT_OTHER)
Admission: EM | Admit: 2020-05-16 | Discharge: 2020-05-16 | Disposition: A | Discharge status: Home / Self Care | Payer: Self-pay | Attending: Emergency Medicine | Admitting: Emergency Medicine

## 2020-05-16 ENCOUNTER — Other Ambulatory Visit: Payer: Self-pay

## 2020-05-16 DIAGNOSIS — F410 Panic disorder [episodic paroxysmal anxiety] without agoraphobia: Secondary | ICD-10-CM

## 2020-05-16 DIAGNOSIS — F419 Anxiety disorder, unspecified: Secondary | ICD-10-CM | POA: Insufficient documentation

## 2020-05-16 LAB — TROPONIN I (HIGH SENSITIVITY): Troponin I (High Sensitivity): 3 ng/L (ref <18)

## 2020-05-16 MED ORDER — HYDROXYZINE HCL 10 MG PO TABS
10.0000 mg | ORAL_TABLET | Freq: Once | ORAL | Status: AC
  Administered 2020-05-16: 10 mg via ORAL
  Filled 2020-05-16: qty 1

## 2020-05-16 MED ORDER — OMEPRAZOLE 20 MG PO CPDR
20.0000 mg | DELAYED_RELEASE_CAPSULE | Freq: Every day | ORAL | 0 refills | Status: DC
Start: 2020-05-16 — End: 2020-07-12

## 2020-05-16 MED ORDER — ALUM & MAG HYDROXIDE-SIMETH 200-200-20 MG/5ML PO SUSP
30.0000 mL | Freq: Once | ORAL | Status: AC
  Administered 2020-05-16: 30 mL via ORAL
  Filled 2020-05-16: qty 30

## 2020-05-16 NOTE — ED Provider Notes (Signed)
[facility] EMERGENCY DEPARTMENT Provider Note   CSN: [id number] Arrival date & time: 05/16/20  0431     History Chief Complaint  Patient presents with  . Anxiety    Jesse Mckinney is a 34 y.o. male.  The history is provided by the patient.  Anxiety This is a recurrent problem. The current episode started less than 1 hour ago. The problem occurs constantly. The problem has not changed since onset.Pertinent negatives include no chest pain, no abdominal pain, no headaches and no shortness of breath. Nothing aggravates the symptoms. Nothing relieves the symptoms. He has tried nothing for the symptoms. The treatment provided no relief.  Patient with anxiety and GERD  presents with anxiety.  Patient reports this happens daily.  No CP, no SOB.  No n/v/d.  No exertional symptoms.  He feels anxious.  No SI or HI. When asked if he is taking the prescribed medications for anxiety or GERD he states he is not.  No travel.  No leg pain. No weakness nor numbness. No changes in vision nor speech.      Past Medical History:  Diagnosis Date  . Anxiety   . GERD (gastroesophageal reflux disease)   . Panic attack     There are no problems to display for this patient.   Past Surgical History:  Procedure Laterality Date  . KNEE SURGERY         Family History  Problem Relation Age of Onset  . Healthy Mother   . Healthy Father     Social History   Tobacco Use  . Smoking status: Never Smoker  . Smokeless tobacco: Never Used  Vaping Use  . Vaping Use: Never used  Substance Use Topics  . Alcohol use: Not Currently    Comment: occ  . Drug use: Never    Home Medications Prior to Admission medications   Medication Sig Start Date End Date Taking? Authorizing Provider  busPIRone (BUSPAR) 10 MG tablet Take 1 tablet (10 mg total) by mouth 3 (three) times daily. 11/24/18   Eustace Moore, MD  hydrOXYzine (ATARAX/VISTARIL) 25 MG tablet Take 1 tablet (25 mg total) by mouth  every 6 (six) hours. 05/04/20   Henderly, Britni A, PA-C  lidocaine (LIDODERM) 5 % Place 1 patch onto the skin daily. Remove & Discard patch within 12 hours or as directed by MD 05/04/20   Henderly, Britni A, PA-C  methocarbamol (ROBAXIN) 500 MG tablet Take 1 tablet (500 mg total) by mouth 2 (two) times daily. 05/04/20   Henderly, Britni A, PA-C  naproxen (NAPROSYN) 500 MG tablet Take 1 tablet (500 mg total) by mouth 2 (two) times daily. 05/04/20   Henderly, Britni A, PA-C  omeprazole (PRILOSEC) 20 MG capsule Take 1 capsule (20 mg total) by mouth daily. 05/16/20   Jermaine Tholl, MD  pantoprazole (PROTONIX) 40 MG tablet Take 1 tablet (40 mg total) by mouth daily. Patient not taking: Reported on 05/04/2020 04/20/19   Geoffery Lyons, MD    Allergies    Iodinated diagnostic agents, Iodine, and Mushroom extract complex  Review of Systems   Review of Systems  Constitutional: Negative for fever.  HENT: Negative for congestion.   Eyes: Negative for visual disturbance.  Respiratory: Negative for shortness of breath.   Cardiovascular: Negative for chest pain, palpitations and leg swelling.  Gastrointestinal: Negative for abdominal pain.  Genitourinary: Negative for difficulty urinating.  Musculoskeletal: Negative for arthralgias.  Skin: Negative for rash.  Neurological: Negative for headaches.  Psychiatric/Behavioral:  Negative for decreased concentration, dysphoric mood and hallucinations. The patient is nervous/anxious.   All other systems reviewed and are negative.   Physical Exam Updated Vital Signs BP 131/80 (BP Location: Right Arm)   Pulse 67   Temp 98.7 F (37.1 C) (Oral)   Resp 17   Ht 6\' 6"  (1.981 m)   Wt (!) 190.5 kg   SpO2 98%   BMI 48.53 kg/m   Physical Exam Vitals and nursing note reviewed.  Constitutional:      General: He is not in acute distress.    Appearance: Normal appearance.  HENT:     Head: Normocephalic and atraumatic.     Nose: Nose normal.  Eyes:      Conjunctiva/sclera: Conjunctivae normal.     Pupils: Pupils are equal, round, and reactive to light.  Cardiovascular:     Rate and Rhythm: Normal rate and regular rhythm.     Pulses: Normal pulses.     Heart sounds: Normal heart sounds.  Pulmonary:     Effort: Pulmonary effort is normal.     Breath sounds: Normal breath sounds.  Abdominal:     General: Abdomen is flat. Bowel sounds are normal.     Palpations: Abdomen is soft.     Tenderness: There is no abdominal tenderness. There is no guarding.  Musculoskeletal:        General: Normal range of motion.     Cervical back: Normal range of motion and neck supple.  Skin:    General: Skin is warm and dry.     Capillary Refill: Capillary refill takes less than 2 seconds.  Neurological:     General: No focal deficit present.     Mental Status: He is oriented to person, place, and time.  Psychiatric:        Mood and Affect: Mood is anxious.     ED Results / Procedures / Treatments   Labs (all labs ordered are listed, but only abnormal results are displayed) Labs Reviewed  TROPONIN I (HIGH SENSITIVITY)    EKG EKG Interpretation  Date/Time:  Tuesday May 16 2020 04:40:34 EDT Ventricular Rate:  63 PR Interval:    QRS Duration: 95 QT Interval:  387 QTC Calculation: 397 R Axis:   73 Text Interpretation: Sinus rhythm Confirmed by 11-21[birth year]1985, Glenis Musolf (Nicanor Alcon) on 05/16/2020 4:52:45 AM   Radiology No results found.  Procedures Procedures (including critical care time)  Medications Ordered in ED Medications  alum & mag hydroxide-simeth (MAALOX/MYLANTA) 200-200-20 MG/5ML suspension 30 mL (has no administration in time range)  hydrOXYzine (ATARAX/VISTARIL) tablet 10 mg (has no administration in time range)    ED Course  I have reviewed the triage vital signs and the nursing notes.  Pertinent labs & imaging results that were available during my care of the patient were reviewed by me and considered in my medical decision making  (see chart for details).    Has been seen for same multiple times.  EKG is normal as is troponin.  Patient is not taking medications.  I have advised starting the atarax already written and I will write for GERD medications.  I have advised the patient to obtain a PMD and also a counselor and I have given outpatient resources.  I suspect the patient will need an SSRI as well as therapy.   Amiere Cawley was evaluated in Emergency Department on 05/16/2020 for the symptoms described in the history of present illness. He was evaluated in the context of the global COVID-19  pandemic, which necessitated consideration that the patient might be at risk for infection with the SARS-CoV-2 virus that causes COVID-19. Institutional protocols and algorithms that pertain to the evaluation of patients at risk for COVID-19 are in a state of rapid change based on information released by regulatory bodies including the CDC and federal and state organizations. These policies and algorithms were followed during the patient's care in the ED.  Final Clinical Impression(s) / ED Diagnoses Final diagnoses:  Anxiety attack   Return for intractable cough, coughing up blood,fevers >100.4 unrelieved by medication, shortness of breath, intractable vomiting, chest pain, shortness of breath, weakness,numbness, changes in speech, facial asymmetry,abdominal pain, passing out,Inability to tolerate liquids or food, cough, altered mental status or any concerns. No signs of systemic illness or infection. The patient is nontoxic-appearing on exam and vital signs are within normal limits.   I have reviewed the triage vital signs and the nursing notes. Pertinent labs &imaging results that were available during my care of the patient were reviewed by me and considered in my medical decision making (see chart for details).After history, exam, and medical workup I feel the patient has beenappropriately medically screened and is safe  for discharge home. Pertinent diagnoses were discussed with the patient. Patient was given return precautions.  Rx / DC Orders ED Discharge Orders         Ordered    omeprazole (PRILOSEC) 20 MG capsule  Daily        05/16/20 0451           Jourdin Connors, MD 05/16/20 2951

## 2020-05-16 NOTE — ED Triage Notes (Signed)
Patient arrived via [facility] c/o anxiety attack waking patient from sleep . Patient has been seen for same recently at [facility]. Patient is AO x 4, VS WDL, Normal gait.

## 2020-07-08 ENCOUNTER — Other Ambulatory Visit: Payer: Self-pay

## 2020-07-08 ENCOUNTER — Encounter (HOSPITAL_COMMUNITY): Payer: Self-pay

## 2020-07-08 ENCOUNTER — Emergency Department (HOSPITAL_COMMUNITY)
Admission: EM | Admit: 2020-07-08 | Discharge: 2020-07-08 | Disposition: A | Discharge status: Home / Self Care | Payer: BC Managed Care – PPO | Attending: Emergency Medicine | Admitting: Emergency Medicine

## 2020-07-08 ENCOUNTER — Emergency Department (HOSPITAL_COMMUNITY): Payer: BC Managed Care – PPO

## 2020-07-08 DIAGNOSIS — R002 Palpitations: Secondary | ICD-10-CM | POA: Diagnosis not present

## 2020-07-08 DIAGNOSIS — K219 Gastro-esophageal reflux disease without esophagitis: Secondary | ICD-10-CM | POA: Diagnosis not present

## 2020-07-08 DIAGNOSIS — R0789 Other chest pain: Secondary | ICD-10-CM | POA: Insufficient documentation

## 2020-07-08 DIAGNOSIS — R42 Dizziness and giddiness: Secondary | ICD-10-CM | POA: Diagnosis not present

## 2020-07-08 DIAGNOSIS — R109 Unspecified abdominal pain: Secondary | ICD-10-CM | POA: Diagnosis not present

## 2020-07-08 LAB — LIPASE, BLOOD: Lipase: 35 U/L (ref 11–51)

## 2020-07-08 LAB — COMPREHENSIVE METABOLIC PANEL
ALT: 19 U/L (ref 0–44)
AST: 15 U/L (ref 15–41)
Albumin: 4.3 g/dL (ref 3.5–5.0)
Alkaline Phosphatase: 62 U/L (ref 38–126)
Anion gap: 5 (ref 5–15)
BUN: 17 mg/dL (ref 6–20)
CO2: 28 mmol/L (ref 22–32)
Calcium: 8.9 mg/dL (ref 8.9–10.3)
Chloride: 105 mmol/L (ref 98–111)
Creatinine, Ser: 0.98 mg/dL (ref 0.61–1.24)
GFR, Estimated: >60 mL/min (ref >60)
Glucose, Bld: 100 mg/dL — ABNORMAL HIGH (ref 70–99)
Potassium: 3.8 mmol/L (ref 3.5–5.1)
Sodium: 138 mmol/L (ref 135–145)
Total Bilirubin: 1.1 mg/dL (ref 0.3–1.2)
Total Protein: 7.4 g/dL (ref 6.5–8.1)

## 2020-07-08 LAB — CBC WITH DIFFERENTIAL/PLATELET
Abs Immature Granulocytes: 0.02 10*3/uL (ref 0.00–0.07)
Basophils Absolute: 0 10*3/uL (ref 0.0–0.1)
Basophils Relative: 0 %
Eosinophils Absolute: 0.1 10*3/uL (ref 0.0–0.5)
Eosinophils Relative: 1 %
HCT: 46.8 % (ref 39.0–52.0)
Hemoglobin: 14.7 g/dL (ref 13.0–17.0)
Immature Granulocytes: 0 %
Lymphocytes Relative: 26 %
Lymphs Abs: 2.1 10*3/uL (ref 0.7–4.0)
MCH: 26.9 pg (ref 26.0–34.0)
MCHC: 31.4 g/dL (ref 30.0–36.0)
MCV: 85.7 fL (ref 80.0–100.0)
Monocytes Absolute: 0.6 10*3/uL (ref 0.1–1.0)
Monocytes Relative: 7 %
Neutro Abs: 5.2 10*3/uL (ref 1.7–7.7)
Neutrophils Relative %: 66 %
Platelets: 185 10*3/uL (ref 150–400)
RBC: 5.46 MIL/uL (ref 4.22–5.81)
RDW: 13.7 % (ref 11.5–15.5)
WBC: 8 10*3/uL (ref 4.0–10.5)
nRBC: 0 % (ref 0.0–0.2)

## 2020-07-08 LAB — URINALYSIS, ROUTINE W REFLEX MICROSCOPIC
Bilirubin Urine: NEGATIVE
Glucose, UA: NEGATIVE mg/dL
Hgb urine dipstick: NEGATIVE
Ketones, ur: NEGATIVE mg/dL
Leukocytes,Ua: NEGATIVE
Nitrite: NEGATIVE
Protein, ur: NEGATIVE mg/dL
Specific Gravity, Urine: 1.029 (ref 1.005–1.030)
pH: 5 (ref 5.0–8.0)

## 2020-07-08 LAB — TROPONIN I (HIGH SENSITIVITY): Troponin I (High Sensitivity): 3 ng/L (ref <18)

## 2020-07-08 NOTE — Discharge Instructions (Addendum)
Your work up was without emergent or significant abnormalities. Your ekg was normal and you have no evidence of emergent Heart or Lung issues. You may need to wear a holter monitor which can be set up by your primary care doctor. I have also given you a referral to cardiology . Your caregiver has diagnosed you as having chest pain that is not specific for one problem, but does not require admission.  You are at low risk for an acute heart condition or other serious illness. Chest pain comes from many different causes.  SEEK IMMEDIATE MEDICAL ATTENTION IF: You have severe chest pain, especially if the pain is crushing or pressure-like and spreads to the arms, back, neck, or jaw, or if you have sweating, nausea (feeling sick to your stomach), or shortness of breath. THIS IS AN EMERGENCY. Don't wait to see if the pain will go away. Get medical help at once. Call 911 or 0 (operator). DO NOT drive yourself to the hospital.  Your chest pain gets worse and does not go away with rest.  You have an attack of chest pain lasting longer than usual, despite rest and treatment with the medications your caregiver has prescribed.  You wake from sleep with chest pain or shortness of breath.  You feel dizzy or faint.  You have chest pain not typical of your usual pain for which you originally saw your caregiver.

## 2020-07-08 NOTE — ED Provider Notes (Signed)
[facility]-EMERGENCY DEPT Provider Note   CSN: [id number] Arrival date & time: 07/08/20  0443     History Chief Complaint  Patient presents with  . Abdominal Pain  . Palpitations    Jesse Mckinney is a 34 y.o. male who presents emergency department with a chief complaint of abdominal pain, chest tightness and palpitations.  Jesse Mckinney has had intermittent symptoms of palpitations going on for more than 1 week.  Jesse Mckinney has had some significant economic and familial stress.  Jesse Mckinney has had difficulty sleeping and states that at night Jesse Mckinney will wake with a sensation of chest tightness and intermittent palpitations, lightheadedness and feeling as if Jesse Mckinney is going to pass out.  This seems to get better when Jesse Mckinney gets up and moves around.  Jesse Mckinney has a history of reflux and takes medication for this however Jesse Mckinney has had some pain that radiates up to the left and behind the sternum.  Last night the pain radiated down his left arm which prompted his visit today.  Jesse Mckinney does not smoke, Jesse Mckinney does not have a history of hypertension or high cholesterol.  Jesse Mckinney denies a familial history of CAD or MI.  Jesse Mckinney denies unilateral leg swelling, history of DVT, pleuritic chest pain.  HPI  HPI: A 34 year old patient with a history of obesity presents for evaluation of chest pain. Initial onset of pain was more than 6 hours ago. The patient's chest pain is described as heaviness/pressure/tightness and is not worse with exertion. The patient's chest pain is middle- or left-sided, is not well-localized, is not sharp and does not radiate to the arms/jaw/neck. The patient does not complain of nausea and denies diaphoresis. The patient has no history of stroke, has no history of peripheral artery disease, has not smoked in the past 90 days, denies any history of treated diabetes, has no relevant family history of coronary artery disease (first degree relative at less than age 69), is not hypertensive and has no history of  hypercholesterolemia.   Past Medical History:  Diagnosis Date  . Anxiety   . GERD (gastroesophageal reflux disease)   . Panic attack     There are no problems to display for this patient.   Past Surgical History:  Procedure Laterality Date  . KNEE SURGERY         Family History  Problem Relation Age of Onset  . Healthy Mother   . Healthy Father     Social History   Tobacco Use  . Smoking status: Never Smoker  . Smokeless tobacco: Never Used  Vaping Use  . Vaping Use: Never used  Substance Use Topics  . Alcohol use: Not Currently    Comment: occ  . Drug use: Never    Home Medications Prior to Admission medications   Medication Sig Start Date End Date Taking? Authorizing Provider  busPIRone (BUSPAR) 10 MG tablet Take 1 tablet (10 mg total) by mouth 3 (three) times daily. 11/24/18   Eustace Moore, MD  hydrOXYzine (ATARAX/VISTARIL) 25 MG tablet Take 1 tablet (25 mg total) by mouth every 6 (six) hours. 05/04/20   Henderly, Britni A, PA-C  lidocaine (LIDODERM) 5 % Place 1 patch onto the skin daily. Remove & Discard patch within 12 hours or as directed by MD 05/04/20   Henderly, Britni A, PA-C  methocarbamol (ROBAXIN) 500 MG tablet Take 1 tablet (500 mg total) by mouth 2 (two) times daily. 05/04/20   Henderly, Britni A, PA-C  naproxen (NAPROSYN) 500 MG tablet Take  1 tablet (500 mg total) by mouth 2 (two) times daily. 05/04/20   Henderly, Britni A, PA-C  omeprazole (PRILOSEC) 20 MG capsule Take 1 capsule (20 mg total) by mouth daily. 05/16/20   Palumbo, April, MD  pantoprazole (PROTONIX) 40 MG tablet Take 1 tablet (40 mg total) by mouth daily. Patient not taking: Reported on 05/04/2020 04/20/19   Geoffery Lyons, MD    Allergies    Iodinated diagnostic agents, Iodine, and Mushroom extract complex  Review of Systems   Review of Systems Ten systems reviewed and are negative for acute change, except as noted in the HPI.  Physical Exam Updated Vital Signs BP 133/80   Pulse  (!) 51   Resp (!) 27   Ht 6\' 6"  (1.981 m)   Wt (!) 188.2 kg   SpO2 100%   BMI 47.96 kg/m   Physical Exam Vitals and nursing note reviewed.  Constitutional:      General: Jesse Mckinney is not in acute distress.    Appearance: Jesse Mckinney is well-developed and well-nourished. Jesse Mckinney is not diaphoretic.  HENT:     Head: Normocephalic and atraumatic.     Mouth/Throat:     Mouth: Mucous membranes are moist.  Eyes:     General: No scleral icterus.    Extraocular Movements: Extraocular movements intact.     Conjunctiva/sclera: Conjunctivae normal.     Pupils: Pupils are equal, round, and reactive to light.  Cardiovascular:     Rate and Rhythm: Normal rate and regular rhythm.     Heart sounds: Normal heart sounds.  Pulmonary:     Effort: Pulmonary effort is normal. No respiratory distress.     Breath sounds: Normal breath sounds.  Abdominal:     General: There is no distension.     Palpations: Abdomen is soft.     Tenderness: There is no abdominal tenderness.  Musculoskeletal:        General: No edema.     Cervical back: Normal range of motion and neck supple.  Skin:    General: Skin is warm and dry.  Neurological:     Mental Status: Jesse Mckinney is alert and oriented to person, place, and time.  Psychiatric:        Behavior: Behavior normal.     ED Results / Procedures / Treatments   Labs (all labs ordered are listed, but only abnormal results are displayed) Labs Reviewed  COMPREHENSIVE METABOLIC PANEL - Abnormal; Notable for the following components:      Result Value   Glucose, Bld 100 (*)    All other components within normal limits  CBC WITH DIFFERENTIAL/PLATELET  LIPASE, BLOOD  URINALYSIS, ROUTINE W REFLEX MICROSCOPIC  TROPONIN I (HIGH SENSITIVITY)    EKG EKG Interpretation  Date/Time:  Saturday July 08 2020 04:51:14 EST Ventricular Rate:  55 PR Interval:    QRS Duration: 95 QT Interval:  410 QTC Calculation: 393 R Axis:   67 Text Interpretation: Sinus rhythm since last tracing no  significant change Confirmed by [id number] [phone]) on 07/08/2020 7:42:10 AM   Radiology DG Abd Acute W/Chest  Result Date: 07/08/2020 CLINICAL DATA:  Palpitations.  Epigastric abdominal pain. EXAM: DG ABDOMEN ACUTE WITH 1 VIEW CHEST COMPARISON:  None. FINDINGS: There is no evidence of dilated bowel loops or free intraperitoneal air. No radiopaque calculi or other significant radiographic abnormality is seen. Heart size and mediastinal contours are within normal limits. Both lungs are clear. IMPRESSION: Negative abdominal radiographs.  No acute cardiopulmonary disease. Electronically Signed  By: Lupita Raider M.D.   On: 07/08/2020 08:11    Procedures Procedures (including critical care time)  Medications Ordered in ED Medications - No data to display  ED Course  I have reviewed the triage vital signs and the nursing notes.  Pertinent labs & imaging results that were available during my care of the patient were reviewed by me and considered in my medical decision making (see chart for details).    MDM Rules/Calculators/A&P HEAR Score: 2                        Given the large differential diagnosis for Tania Perrott, the decision making in this case is of high complexity.  After evaluating all of the data points in this case, the presentation of Gina Costilla is NOT consistent with Acute Coronary Syndrome (ACS) and/or myocardial ischemia, pulmonary embolism, aortic dissection; Borhaave's, significant arrythmia, pneumothorax, cardiac tamponade, or other emergent cardiopulmonary condition.  Further, the presentation of Gregary Blackard is NOT consistent with pericarditis, myocarditis, cholecystitis, pancreatitis, mediastinitis, endocarditis, new valvular disease.  Additionally, the presentation of [place] Crawfordis NOT consistent with flail chest, cardiac contusion, ARDS, or significant intra-thoracic or intra-abdominal bleeding.  Moreover, this presentation is NOT consistent  with pneumonia, sepsis, or pyelonephritis.  The patient's work up included CBC CMP urine, lipase and troponin without any significant abnormalities.  I ordered and reviewed the patient's acute abdomen which have which shows no abnormalities, EKG shows sinus bradycardia cardia at a rate of 55.  Patient is PERC negative   Strict return and follow-up precautions have been given by me personally or by detailed written instruction given verbally by nursing staff using the teach back method to the patient/family/caregiver(s).  Data Reviewed/Counseling: I have reviewed the patient's vital signs, nursing notes, and other relevant tests/information. I had a detailed discussion regarding the historical points, exam findings, and any diagnostic results supporting the discharge diagnosis. I also discussed the need for outpatient follow-up and the need to return to the ED if symptoms worsen or if there are any questions or concerns that arise at home.  Final Clinical Impression(s) / ED Diagnoses Final diagnoses:  Atypical chest pain  Palpitations    Rx / DC Orders ED Discharge Orders    None       Arthor Captain, PA-C 07/08/20 1005    Mancel Bale, MD 07/08/20 1954

## 2020-07-08 NOTE — ED Notes (Signed)
Patient transported to CT, not currently in room.

## 2020-07-08 NOTE — ED Triage Notes (Signed)
Pt came in with c/o epigastric pain that feels like pressure and palpitations. Pt states the palpitations have been coming and going for the last few days. He states that he does have GERD and takes Nexium, but the epigastric pain will go away for a short time then come back. This has been going on for two days. Pt states it is worse when he is trying to sleep. Vitals WNL.

## 2020-07-10 NOTE — Progress Notes (Signed)
Cardiology Office Note:    Date:  07/12/2020   ID:  Jesse Mckinney, DOB 1986/07/11, MRN [id number]  PCP:  Patient, No Pcp Per  Cardiologist:  No primary care provider on file.   Referring MD: Mancel Bale, MD   Chief Complaint  Patient presents with  . Palpitations  . Irregular Heart Beat  . Chest Pain    History of Present Illness:    Jesse Mckinney is a 34 y.o. male with a hx of  Chest pain from [facility] ED, Dr. Mancel Bale.  Per Dr. Effie Shy on 07/08/2020:"Jesse Mckinney is a 34 y.o. male who presents emergency department with a chief complaint of abdominal pain, chest tightness and palpitations.  He has had intermittent symptoms of palpitations going on for more than 1 week.  He has had some significant economic and familial stress.  He has had difficulty sleeping and states that at night he will wake with a sensation of chest tightness and intermittent palpitations, lightheadedness and feeling as if he is going to pass out.  This seems to get better when he gets up and moves around.  He has a history of reflux and takes medication for this however he has had some pain that radiates up to the left and behind the sternum.  Last night the pain radiated down his left arm which prompted his visit today.  He does not smoke, he does not have a history of hypertension or high cholesterol.  He denies a familial history of CAD or MI.  He denies unilateral leg swelling, history of DVT, pleuritic chest pain."  He has been having incessant palpitations.  He has been worked up at [facility].  He had an echocardiogram a Holter monitor and exercise testing back in June.  He complains of chest discomfort if he lies on his left side.  Since he moves into a different position it goes away.  He has been having continuous palpitations.  This is a "flutter".  He had a pretty complete work-up done at [facility] outlined below with a 3-day monitor and an echocardiogram that did not reveal  much.  He does not smoke, is not diabetic, has no family history of significant vascular disease, and is not diabetic.  Past Medical History:  Diagnosis Date  . Anxiety   . Chest pain   . GERD (gastroesophageal reflux disease)   . Palpitations   . Panic attack     Past Surgical History:  Procedure Laterality Date  . KNEE SURGERY      Current Medications: Current Meds  Medication Sig  . esomeprazole (NEXIUM) 20 MG capsule Take 20 mg by mouth daily at 12 noon.     Allergies:   Iodinated diagnostic agents, Iodine, and Mushroom extract complex   Social History   Socioeconomic History  . Marital status: Married    Spouse name: Not on file  . Number of children: Not on file  . Years of education: Not on file  . Highest education level: Not on file  Occupational History  . Not on file  Tobacco Use  . Smoking status: Never Smoker  . Smokeless tobacco: Never Used  Vaping Use  . Vaping Use: Never used  Substance and Sexual Activity  . Alcohol use: Not Currently    Comment: occ  . Drug use: Never  . Sexual activity: Not on file  Other Topics Concern  . Not on file  Social History Narrative  . Not on file   Social  Determinants of Health   Financial Resource Strain: Not on file  Food Insecurity: Not on file  Transportation Needs: Not on file  Physical Activity: Not on file  Stress: Not on file  Social Connections: Not on file     Family History: The patient's family history includes Healthy in his father and mother.  ROS:   Please see the history of present illness.    Occultly sleeping.  Has had a sleep study that did not reveal sleep apnea but he only slept 4 hours.  He routinely does not get good sleep.  His wife does not complain that he snores.  States his total cholesterol was 146 recently.  This laboratory data cannot be found all other systems reviewed and are negative.  EKGs/Labs/Other Studies Reviewed:    The following studies were reviewed  today: 3-day monitor June 2021: Ferne Reus, MD - 02/01/2020  Formatting of this note might be different from the original.  *The predominant rhythm was sinus bradycardia to sinus tachycardia. *The Maximum Heart Rate recorded was 117 bpm, Day 2 / 01:52:10 pm, the Minimum Heart Rate recorded was 44 bpm, Day 3 / 07:26:42 am and the Average Heart Rate was 69 bpm. *There were 10  PSVCs with a burden of < 0.01 %. *Diary events were not entered. There were 0 Patient triggered events.   Note: Possible inverted ECG due to lead reversal   2D Doppler echocardiogram January 19, 2020: INTERPRETATION ---------------------------------------------------------------   NORMAL LEFT VENTRICULAR SYSTOLIC FUNCTION WITH MILD LVH   NORMAL LA PRESSURES WITH NORMAL DIASTOLIC FUNCTION   NORMAL RIGHT VENTRICULAR SYSTOLIC FUNCTION   VALVULAR REGURGITATION: TRIVIAL MR, TRIVIAL PR, TRIVIAL TR   NO VALVULAR STENOSIS   NO PRIOR STUDY FOR COMPARISON      EKG:  EKG 07/10/2020 demonstrated normal sinus rhythm with normal appearance.  Recent Labs: 10/26/2019: TSH 0.406 05/04/2020: Magnesium 2.1 07/08/2020: ALT 19; BUN 17; Creatinine, Ser 0.98; Hemoglobin 14.7; Platelets 185; Potassium 3.8; Sodium 138  Recent Lipid Panel No results found for: CHOL, TRIG, HDL, CHOLHDL, VLDL, LDLCALC, LDLDIRECT  Physical Exam:    VS:  BP 100/70   Pulse 75   Ht 6\' 6"  (1.981 m)   Wt (!) 419 lb 6.4 oz (190.2 kg)   SpO2 98%   BMI 48.47 kg/m     Wt Readings from Last 3 Encounters:  07/12/20 (!) 419 lb 6.4 oz (190.2 kg)  07/08/20 (!) 415 lb (188.2 kg)  05/16/20 (!) 419 lb 15.6 oz (190.5 kg)     GEN: BMI is 48.. No acute distress HEENT: Normal NECK: No JVD. LYMPHATICS: No lymphadenopathy CARDIAC: No murmur. RRR no gallop, or edema. VASCULAR:  Normal Pulses. No bruits. RESPIRATORY:  Clear to auscultation without rales, wheezing or rhonchi  ABDOMEN: Soft, non-tender, non-distended, No pulsatile  mass, MUSCULOSKELETAL: No deformity  SKIN: Warm and dry NEUROLOGIC:  Alert and oriented x 3 PSYCHIATRIC:  Normal affect   ASSESSMENT:    1. Palpitations   2. Chest pain of uncertain etiology   3. Morbid obesity (HCC)    PLAN:    In order of problems listed above:  1. Premature supraventricular contractions were noted on the monitor at [facility] however the patient states that he had no symptoms during that period of monitoring.  We will do a 30-day monitor and keep a precise calendar of symptoms. 2. This pain is not ischemic or cardiac related.  This sounds more musculoskeletal.  No work-up. 3. Discussed aerobic activity and weight  reduction.   Medication Adjustments/Labs and Tests Ordered: Current medicines are reviewed at length with the patient today.  Concerns regarding medicines are outlined above.  Orders Placed This Encounter  Procedures  . Cardiac event monitor   No orders of the defined types were placed in this encounter.   Patient Instructions  Medication Instructions:  Your physician recommends that you continue on your current medications as directed. Please refer to the Current Medication list given to you today.  *If you need a refill on your cardiac medications before your next appointment, please call your pharmacy*   Lab Work: None If you have labs (blood work) drawn today and your tests are completely normal, you will receive your results only by: Marland Kitchen MyChart Message (if you have MyChart) OR . A paper copy in the mail If you have any lab test that is abnormal or we need to change your treatment, we will call you to review the results.   Testing/Procedures: Your physician has recommended that you wear an event monitor. Event monitors are medical devices that record the heart's electrical activity. Doctors most often [place] these monitors to diagnose arrhythmias. Arrhythmias are problems with the speed or rhythm of the heartbeat. The monitor is a small, portable  device. You can wear one while you do your normal daily activities. This is usually used to diagnose what is causing palpitations/syncope (passing out).    Follow-Up: At [facility], you and your health needs are our priority.  As part of our continuing mission to provide you with exceptional heart care, we have created designated Provider Care Teams.  These Care Teams include your primary Cardiologist (physician) and Advanced Practice Providers (APPs -  Physician Assistants and Nurse Practitioners) who all work together to provide you with the care you need, when you need it.  We recommend signing up for the patient portal called "MyChart".  Sign up information is provided on this After Visit Summary.  MyChart is used to connect with patients for Virtual Visits (Telemedicine).  Patients are able to view lab/test results, encounter notes, upcoming appointments, etc.  Non-urgent messages can be sent to your provider as well.   To learn more about what you can do with MyChart, go to [email or web address].    Your next appointment:   As needed  The format for your next appointment:   In Person  Provider:   You may see Dr. Verdis Prime or one of the following Advanced Practice Providers on your designated Care Team:    Norma Fredrickson, NP  Nada Boozer, NP  Georgie Chard, NP    Other Instructions      Signed, Lesleigh Noe, MD  07/12/2020 4:37 PM     [facility]

## 2020-07-12 ENCOUNTER — Other Ambulatory Visit: Payer: Self-pay

## 2020-07-12 ENCOUNTER — Ambulatory Visit (INDEPENDENT_AMBULATORY_CARE_PROVIDER_SITE_OTHER): Payer: BC Managed Care – PPO | Admitting: Interventional Cardiology

## 2020-07-12 ENCOUNTER — Encounter: Payer: Self-pay | Admitting: Interventional Cardiology

## 2020-07-12 VITALS — BP 100/70 | HR 75 | Ht 78.0 in | Wt >= 6400 oz

## 2020-07-12 DIAGNOSIS — R079 Chest pain, unspecified: Secondary | ICD-10-CM | POA: Diagnosis not present

## 2020-07-12 DIAGNOSIS — R002 Palpitations: Secondary | ICD-10-CM | POA: Diagnosis not present

## 2020-07-12 NOTE — Patient Instructions (Signed)
Medication Instructions:  °Your physician recommends that you continue on your current medications as directed. Please refer to the Current Medication list given to you today. ° °*If you need a refill on your cardiac medications before your next appointment, please call your pharmacy* ° ° °Lab Work: °None °If you have labs (blood work) drawn today and your tests are completely normal, you will receive your results only by: °• MyChart Message (if you have MyChart) OR °• A paper copy in the mail °If you have any lab test that is abnormal or we need to change your treatment, we will call you to review the results. ° ° °Testing/Procedures: °Your physician has recommended that you wear an event monitor. Event monitors are medical devices that record the heart’s electrical activity. Doctors most often us these monitors to diagnose arrhythmias. Arrhythmias are problems with the speed or rhythm of the heartbeat. The monitor is a small, portable device. You can wear one while you do your normal daily activities. This is usually used to diagnose what is causing palpitations/syncope (passing out). ° ° ° °Follow-Up: °At [facility], you and your health needs are our priority.  As part of our continuing mission to provide you with exceptional heart care, we have created designated Provider Care Teams.  These Care Teams include your primary Cardiologist (physician) and Advanced Practice Providers (APPs -  Physician Assistants and Nurse Practitioners) who all work together to provide you with the care you need, when you need it. ° °We recommend signing up for the patient portal called "MyChart".  Sign up information is provided on this After Visit Summary.  MyChart is used to connect with patients for Virtual Visits (Telemedicine).  Patients are able to view lab/test results, encounter notes, upcoming appointments, etc.  Non-urgent messages can be sent to your provider as well.   °To learn more about what you can do with  MyChart, go to [email or web address].   ° °Your next appointment:   °As needed ° °The format for your next appointment:   °In Person ° °Provider:   °You may see Dr. Henry Smith or one of the following Advanced Practice Providers on your designated Care Team:   °· Lori Gerhardt, NP °· Laura Ingold, NP °· Jill McDaniel, NP ° ° ° °Other Instructions ° ° °

## 2020-07-18 ENCOUNTER — Encounter (INDEPENDENT_AMBULATORY_CARE_PROVIDER_SITE_OTHER): Payer: BC Managed Care – PPO

## 2020-07-18 DIAGNOSIS — R002 Palpitations: Secondary | ICD-10-CM

## 2020-07-20 NOTE — Telephone Encounter (Signed)
Monitor recordings reviewed with Theodoro Kos. Monitor showed when patient activated it during the night his heart rate was 65 and 90 with a PVC.  At 6:12 AM his heart rate was 123, at 6:14 it was 70 with a PVC and at 6:18 it was 100.

## 2020-08-01 ENCOUNTER — Emergency Department (HOSPITAL_COMMUNITY)
Admission: EM | Admit: 2020-08-01 | Discharge: 2020-08-01 | Disposition: A | Discharge status: Home / Self Care | Payer: 59 | Attending: Emergency Medicine | Admitting: Emergency Medicine

## 2020-08-01 ENCOUNTER — Encounter (HOSPITAL_COMMUNITY): Payer: Self-pay

## 2020-08-01 ENCOUNTER — Other Ambulatory Visit: Payer: Self-pay

## 2020-08-01 DIAGNOSIS — M79602 Pain in left arm: Secondary | ICD-10-CM | POA: Insufficient documentation

## 2020-08-01 DIAGNOSIS — R079 Chest pain, unspecified: Secondary | ICD-10-CM | POA: Diagnosis not present

## 2020-08-01 DIAGNOSIS — Z5321 Procedure and treatment not carried out due to patient leaving prior to being seen by health care provider: Secondary | ICD-10-CM | POA: Insufficient documentation

## 2020-08-01 DIAGNOSIS — M542 Cervicalgia: Secondary | ICD-10-CM | POA: Diagnosis not present

## 2020-08-01 DIAGNOSIS — M79605 Pain in left leg: Secondary | ICD-10-CM | POA: Diagnosis not present

## 2020-08-01 DIAGNOSIS — Z20822 Contact with and (suspected) exposure to covid-19: Secondary | ICD-10-CM | POA: Insufficient documentation

## 2020-08-01 LAB — CBC
HCT: 45.2 % (ref 39.0–52.0)
Hemoglobin: 14.6 g/dL (ref 13.0–17.0)
MCH: 27.4 pg (ref 26.0–34.0)
MCHC: 32.3 g/dL (ref 30.0–36.0)
MCV: 85 fL (ref 80.0–100.0)
Platelets: 186 10*3/uL (ref 150–400)
RBC: 5.32 MIL/uL (ref 4.22–5.81)
RDW: 13.3 % (ref 11.5–15.5)
WBC: 8.1 10*3/uL (ref 4.0–10.5)
nRBC: 0 % (ref 0.0–0.2)

## 2020-08-01 LAB — BASIC METABOLIC PANEL
Anion gap: 9 (ref 5–15)
BUN: 11 mg/dL (ref 6–20)
CO2: 24 mmol/L (ref 22–32)
Calcium: 9 mg/dL (ref 8.9–10.3)
Chloride: 105 mmol/L (ref 98–111)
Creatinine, Ser: 0.9 mg/dL (ref 0.61–1.24)
GFR, Estimated: >60 mL/min (ref >60)
Glucose, Bld: 94 mg/dL (ref 70–99)
Potassium: 3.8 mmol/L (ref 3.5–5.1)
Sodium: 138 mmol/L (ref 135–145)

## 2020-08-01 LAB — RESP PANEL BY RT-PCR (FLU A&B, COVID) ARPGX2
Influenza A by PCR: NEGATIVE
Influenza B by PCR: NEGATIVE
SARS Coronavirus 2 by RT PCR: NEGATIVE

## 2020-08-01 LAB — TROPONIN I (HIGH SENSITIVITY): Troponin I (High Sensitivity): 3 ng/L (ref <18)

## 2020-08-01 NOTE — ED Triage Notes (Addendum)
Patient coming from home with [facility], chest pain starting about 11pm, L sided pain radiating into his neck, hx of similar symptoms, reports 8/10 pain, holter monitor currently in place for palpitations. Patient also reports L leg and arm pain. States he is worried about blood clots.   EMS 20 L A/C 130/90 60 HR 99% RA 324 mg asp  Refused nitro

## 2020-08-01 NOTE — ED Notes (Signed)
Pt states he no longer can wait and has to go to work.

## 2020-08-06 ENCOUNTER — Encounter (HOSPITAL_BASED_OUTPATIENT_CLINIC_OR_DEPARTMENT_OTHER): Payer: Self-pay

## 2020-08-06 ENCOUNTER — Emergency Department (HOSPITAL_COMMUNITY)
Admission: EM | Admit: 2020-08-06 | Discharge: 2020-08-06 | Discharge status: Against Medical Advice | Payer: No Typology Code available for payment source

## 2020-08-06 ENCOUNTER — Other Ambulatory Visit: Payer: Self-pay

## 2020-08-06 ENCOUNTER — Emergency Department (HOSPITAL_BASED_OUTPATIENT_CLINIC_OR_DEPARTMENT_OTHER)
Admission: EM | Admit: 2020-08-06 | Discharge: 2020-08-06 | Disposition: A | Discharge status: Home / Self Care | Payer: 59 | Attending: Emergency Medicine | Admitting: Emergency Medicine

## 2020-08-06 ENCOUNTER — Emergency Department (HOSPITAL_BASED_OUTPATIENT_CLINIC_OR_DEPARTMENT_OTHER): Payer: 59

## 2020-08-06 DIAGNOSIS — M79602 Pain in left arm: Secondary | ICD-10-CM

## 2020-08-06 DIAGNOSIS — R0789 Other chest pain: Secondary | ICD-10-CM | POA: Insufficient documentation

## 2020-08-06 DIAGNOSIS — Z79899 Other long term (current) drug therapy: Secondary | ICD-10-CM | POA: Diagnosis not present

## 2020-08-06 DIAGNOSIS — R079 Chest pain, unspecified: Secondary | ICD-10-CM

## 2020-08-06 LAB — CBC
HCT: 45.7 % (ref 39.0–52.0)
Hemoglobin: 14.6 g/dL (ref 13.0–17.0)
MCH: 27 pg (ref 26.0–34.0)
MCHC: 31.9 g/dL (ref 30.0–36.0)
MCV: 84.6 fL (ref 80.0–100.0)
Platelets: 192 10*3/uL (ref 150–400)
RBC: 5.4 MIL/uL (ref 4.22–5.81)
RDW: 13.4 % (ref 11.5–15.5)
WBC: 6.3 10*3/uL (ref 4.0–10.5)
nRBC: 0 % (ref 0.0–0.2)

## 2020-08-06 LAB — BASIC METABOLIC PANEL
Anion gap: 7 (ref 5–15)
BUN: 10 mg/dL (ref 6–20)
CO2: 27 mmol/L (ref 22–32)
Calcium: 8.6 mg/dL — ABNORMAL LOW (ref 8.9–10.3)
Chloride: 104 mmol/L (ref 98–111)
Creatinine, Ser: 1 mg/dL (ref 0.61–1.24)
GFR, Estimated: >60 mL/min (ref >60)
Glucose, Bld: 99 mg/dL (ref 70–99)
Potassium: 4.1 mmol/L (ref 3.5–5.1)
Sodium: 138 mmol/L (ref 135–145)

## 2020-08-06 LAB — TROPONIN I (HIGH SENSITIVITY): Troponin I (High Sensitivity): 3 ng/L (ref <18)

## 2020-08-06 NOTE — ED Notes (Signed)
EKG presented to [facility]

## 2020-08-06 NOTE — ED Triage Notes (Signed)
Pt Presents POV with CP.  Pt has been experiencing similar Pain (intense Lower Chest) intermittently for past three months. Pt states pain has been keeping him from sleeping as it wakes him up at night.  Pt endorses also feeling lightheaded, SOB, and Left Arm Pain with CP.  Pt states he wears a Holter Monitor but has not received diagnostic results from Monitor.   Pt A&Ox4, GCS 15, no other complaints.  VSS

## 2020-08-06 NOTE — ED Notes (Signed)
Pt discharged to home. Discharge instructions have been discussed with patient and/or family members. Pt verbally acknowledges understanding d/c instructions, and endorses comprehension to checkout at registration before leaving.  °

## 2020-08-06 NOTE — ED Provider Notes (Signed)
[facility] EMERGENCY DEPARTMENT Provider Note   CSN: [id number] Arrival date & time: 08/06/20  3086     History Chief Complaint  Patient presents with  . Chest Pain    Jesse Mckinney is a 35 y.o. male.  HPI      2-3 weeks of chest pain, palpitations Began with palpitations every day throughout the whole day, gradually getting worse, began having chest pains. Happen at night, wakes up with chest pain in the middle of the night every night for 4 months.  2 weeks ago called EMS, declined transport, this AM woke up with diarrhea and chest pain Arm pain comes and goes throughout the day, shoulder with redness, radiates to neck Had sleep study 6 mos ago Pulse has been 48 with rest at home Chest pain feels like a throbbing, aching, middle of chest a little above stomach, doesn't move anywhere else, not sure if moves to arm, started in arm and not chest.  At first started melatonin and benadryl.  Not exertional/positional/pleuritic. Comes on randomly, uncomfortable feeling, arm pain 3/10 now , no cp Feels better if gets up walks around.  NO associated nausea/diaphoresis/shortness of breath No family history of CAD, no hx DVT/PE, immobilization  Seeing Dr. Katrinka Blazing now Had seen [facility] for palpitations  Past Medical History:  Diagnosis Date  . Anxiety   . Chest pain   . GERD (gastroesophageal reflux disease)   . Palpitations   . Panic attack     There are no problems to display for this patient.   Past Surgical History:  Procedure Laterality Date  . KNEE SURGERY         Family History  Problem Relation Age of Onset  . Healthy Mother   . Healthy Father     Social History   Tobacco Use  . Smoking status: Never Smoker  . Smokeless tobacco: Never Used  Vaping Use  . Vaping Use: Never used  Substance Use Topics  . Alcohol use: Not Currently    Comment: occ  . Drug use: Never    Home Medications Prior to Admission medications   Medication Sig  Start Date End Date Taking? Authorizing Provider  esomeprazole (NEXIUM) 20 MG capsule Take 20 mg by mouth daily at 12 noon.    [provider]    Allergies    Nitroglycerin, Iodinated diagnostic agents, Iodine, and Mushroom extract complex  Review of Systems   Review of Systems  Constitutional: Negative for fever.  HENT: Negative for sore throat.   Respiratory: Negative for shortness of breath.   Cardiovascular: Positive for chest pain and palpitations.  Gastrointestinal: Negative for abdominal pain, nausea and vomiting.  Genitourinary: Negative for difficulty urinating.  Musculoskeletal: Positive for arthralgias. Negative for back pain and neck pain.  Skin: Negative for rash.  Neurological: Negative for syncope and headaches.    Physical Exam Updated Vital Signs BP 129/71 (BP Location: Right Arm)   Pulse 60   Temp 98 F (36.7 C) (Oral)   Resp 16   Ht 6\' 6"  (1.981 m)   Wt (!) 188.2 kg   SpO2 98%   BMI 47.96 kg/m   Physical Exam Vitals and nursing note reviewed.  Constitutional:      General: He is not in acute distress.    Appearance: He is well-developed and well-nourished. He is not diaphoretic.  HENT:     Head: Normocephalic and atraumatic.  Eyes:     Extraocular Movements: EOM normal.  Conjunctiva/sclera: Conjunctivae normal.  Cardiovascular:     Rate and Rhythm: Normal rate and regular rhythm.     Pulses: Intact distal pulses.     Heart sounds: Normal heart sounds. No murmur heard. No friction rub. No gallop.   Pulmonary:     Effort: Pulmonary effort is normal. No respiratory distress.     Breath sounds: Normal breath sounds. No wheezing or rales.  Abdominal:     General: There is no distension.     Palpations: Abdomen is soft.     Tenderness: There is no abdominal tenderness. There is no guarding.  Musculoskeletal:        General: No edema.     Cervical back: Normal range of motion.  Skin:    General: Skin is warm and dry.  Neurological:      Mental Status: He is alert and oriented to person, place, and time.     ED Results / Procedures / Treatments   Labs (all labs ordered are listed, but only abnormal results are displayed) Labs Reviewed  BASIC METABOLIC PANEL - Abnormal; Notable for the following components:      Result Value   Calcium 8.6 (*)    All other components within normal limits  CBC  TROPONIN I (HIGH SENSITIVITY)    EKG EKG Interpretation  Date/Time:  Sunday August 06 2020 06:23:15 EST Ventricular Rate:  52 PR Interval:    QRS Duration: 99 QT Interval:  412 QTC Calculation: 384 R Axis:   69 Text Interpretation: Sinus rhythm Low voltage, precordial leads Baseline wander in lead(s) I III aVL V1 No significant change since last tracing Confirmed by Jaleil Renwick ([id number]) on 08/06/2020 7:06:19 AM   Radiology DG Chest 2 View  Result Date: 08/06/2020 CLINICAL DATA:  Shortness of breath due to chest pain EXAM: CHEST - 2 VIEW COMPARISON:  05/04/2020 FINDINGS: Low volume chest which accentuates heart size. There is no edema, consolidation, effusion, or pneumothorax. Artifact from EKG leads. Normal aortic and hilar contours. IMPRESSION: Negative low volume chest. Electronically Signed   By: Jonathon  Watts M.D.   On: 08/06/2020 06:58    Procedures Procedures (including critical care time)  Medications Ordered in ED Medications - No data to display  ED Course  I have reviewed the triage vital signs and the nursing notes.  Pertinent labs & imaging results that were available during my care of the patient were reviewed by me and considered in my medical decision making (see chart for details).    MDM Rules/Calculators/A&P                          34 yo male with history of obesity, palpitations, anxiety, presents with concern for chest pain.   Differential diagnosis for chest pain includes pulmonary embolus, dissection, pneumothorax, pneumonia, ACS, myocarditis, pericarditis.  EKG was done and  evaluate by me and showed no acute ST changes and no signs of pericarditis. Chest x-ray was done and evaluated by me and radiology and showed no sign of pneumonia or pneumothorax. Patient is PERC negative and low risk Wells and have low suspicion for PE.  Patient is low risk HEART score and had delta troponins negative greater than 6hr after pain and doubt ACS.  Do not feel history or exam are consistent with aortic dissection.  Describes arm pain as well, no sign of swelling/DVT, normal pulses and no sign of arterial thrombus.   Recommend continued follow up with Cardiologist, discussed reasons  to return. Patient discharged in stable condition with understanding of reasons to return.     Final Clinical Impression(s) / ED Diagnoses Final diagnoses:  Nonspecific chest pain  Left arm pain    Rx / DC Orders ED Discharge Orders    None       Alvira Monday, MD 08/07/20 [phone]

## 2020-08-06 NOTE — ED Notes (Signed)
Pt transported to XRAY °

## 2020-08-06 NOTE — ED Notes (Signed)
ED Provider at bedside. 

## 2020-08-10 DIAGNOSIS — G4719 Other hypersomnia: Secondary | ICD-10-CM | POA: Diagnosis not present

## 2020-08-10 DIAGNOSIS — R0683 Snoring: Secondary | ICD-10-CM | POA: Diagnosis not present

## 2020-08-14 DIAGNOSIS — G4719 Other hypersomnia: Secondary | ICD-10-CM | POA: Diagnosis not present

## 2020-08-14 DIAGNOSIS — R0683 Snoring: Secondary | ICD-10-CM | POA: Diagnosis not present

## 2020-08-16 DIAGNOSIS — R002 Palpitations: Secondary | ICD-10-CM | POA: Diagnosis not present

## 2020-08-16 DIAGNOSIS — G4719 Other hypersomnia: Secondary | ICD-10-CM | POA: Diagnosis not present

## 2020-08-16 DIAGNOSIS — R0789 Other chest pain: Secondary | ICD-10-CM | POA: Diagnosis not present

## 2020-08-16 DIAGNOSIS — E782 Mixed hyperlipidemia: Secondary | ICD-10-CM | POA: Diagnosis not present

## 2020-08-16 DIAGNOSIS — R69 Illness, unspecified: Secondary | ICD-10-CM | POA: Diagnosis not present

## 2020-08-30 ENCOUNTER — Institutional Professional Consult (permissible substitution): Payer: BC Managed Care – PPO | Admitting: Pulmonary Disease

## 2020-10-02 ENCOUNTER — Ambulatory Visit: Payer: No Typology Code available for payment source | Admitting: Registered Nurse

## 2020-11-18 DIAGNOSIS — Z20822 Contact with and (suspected) exposure to covid-19: Secondary | ICD-10-CM | POA: Diagnosis not present

## 2020-11-18 DIAGNOSIS — Z03818 Encounter for observation for suspected exposure to other biological agents ruled out: Secondary | ICD-10-CM | POA: Diagnosis not present

## 2020-12-05 ENCOUNTER — Other Ambulatory Visit: Payer: Self-pay

## 2020-12-05 ENCOUNTER — Emergency Department (HOSPITAL_COMMUNITY): Payer: BLUE CROSS/BLUE SHIELD

## 2020-12-05 ENCOUNTER — Emergency Department (HOSPITAL_COMMUNITY)
Admission: EM | Admit: 2020-12-05 | Discharge: 2020-12-05 | Disposition: A | Discharge status: Home / Self Care | Payer: BLUE CROSS/BLUE SHIELD | Attending: Emergency Medicine | Admitting: Emergency Medicine

## 2020-12-05 ENCOUNTER — Encounter (HOSPITAL_COMMUNITY): Payer: Self-pay | Admitting: Emergency Medicine

## 2020-12-05 DIAGNOSIS — R079 Chest pain, unspecified: Secondary | ICD-10-CM | POA: Diagnosis not present

## 2020-12-05 DIAGNOSIS — R002 Palpitations: Secondary | ICD-10-CM | POA: Diagnosis not present

## 2020-12-05 LAB — CBC
HCT: 51.1 % (ref 39.0–52.0)
Hemoglobin: 15.4 g/dL (ref 13.0–17.0)
MCH: 27.2 pg (ref 26.0–34.0)
MCHC: 30.1 g/dL (ref 30.0–36.0)
MCV: 90.3 fL (ref 80.0–100.0)
Platelets: 174 10*3/uL (ref 150–400)
RBC: 5.66 MIL/uL (ref 4.22–5.81)
RDW: 13.3 % (ref 11.5–15.5)
WBC: 6.8 10*3/uL (ref 4.0–10.5)
nRBC: 0 % (ref 0.0–0.2)

## 2020-12-05 LAB — BASIC METABOLIC PANEL
Anion gap: 6 (ref 5–15)
BUN: 14 mg/dL (ref 6–20)
CO2: 26 mmol/L (ref 22–32)
Calcium: 8.9 mg/dL (ref 8.9–10.3)
Chloride: 106 mmol/L (ref 98–111)
Creatinine, Ser: 0.9 mg/dL (ref 0.61–1.24)
GFR, Estimated: >60 mL/min (ref >60)
Glucose, Bld: 92 mg/dL (ref 70–99)
Potassium: 4.5 mmol/L (ref 3.5–5.1)
Sodium: 138 mmol/L (ref 135–145)

## 2020-12-05 LAB — HEPATIC FUNCTION PANEL
ALT: 22 U/L (ref 0–44)
AST: 26 U/L (ref 15–41)
Albumin: 3.9 g/dL (ref 3.5–5.0)
Alkaline Phosphatase: 66 U/L (ref 38–126)
Bilirubin, Direct: 0.3 mg/dL — ABNORMAL HIGH (ref 0.0–0.2)
Indirect Bilirubin: 1.1 mg/dL — ABNORMAL HIGH (ref 0.3–0.9)
Total Bilirubin: 1.4 mg/dL — ABNORMAL HIGH (ref 0.3–1.2)
Total Protein: 7.4 g/dL (ref 6.5–8.1)

## 2020-12-05 LAB — LIPASE, BLOOD: Lipase: 80 U/L — ABNORMAL HIGH (ref 11–51)

## 2020-12-05 LAB — TROPONIN I (HIGH SENSITIVITY)
Troponin I (High Sensitivity): 3 ng/L (ref <18)
Troponin I (High Sensitivity): 3 ng/L (ref <18)

## 2020-12-05 MED ORDER — OMEPRAZOLE 20 MG PO CPDR: 20.0000 mg | DELAYED_RELEASE_CAPSULE | Freq: Every day | ORAL | 1 refills | Status: DC

## 2020-12-05 NOTE — ED Triage Notes (Signed)
Pt reports chest tightness that radiates to left arm that started today that is getting worse.

## 2020-12-05 NOTE — ED Notes (Signed)
Pt stated he recently had VS as he was at the door when asked. Reviewed d/c instructions. Agrees with understanding instructions.

## 2020-12-05 NOTE — ED Triage Notes (Signed)
Per EMS pt from home for anxiety. Was on medications for it but took self off and today is first episode in 6 months. Denies SI or HI.

## 2020-12-05 NOTE — Discharge Instructions (Signed)
1.  Follow instructions for gastroesophageal reflux disease.  Start omeprazole daily as prescribed. 2.  Follow-up with your family doctor and cardiologist. 3.  Return to the emergency department if you have recurrence of pain, new concerning or worsening symptoms.

## 2020-12-05 NOTE — ED Provider Notes (Signed)
[facility] Note   CSN: [id number] Arrival date & time: 12/05/20  1033     History Chief Complaint  Patient presents with  . Anxiety  . Chest Pain  . Arm Pain    Jesse Mckinney is a 35 y.o. male.  HPI Patient reports that he was awakened this morning by central chest pain.  He reports it was pretty uncomfortable and radiated up towards his left shoulder.  Reports pain persisted after he got up with some epigastric aching discomfort.  Reports the pain is gone now.  He did not have syncope or shortness of breath.  He has not had recent lower extremity swelling or calf pain.  Patient reports he did have a similar episode of pain about a few months ago.  He reports that again it was in the morning while he was lying supine.  He had a evaluation by cardiology with a 30-day event monitor for palpitations.  Reports nothing significantly abnormal identified.  He reports he did not have a cardiac stress test because at that time they suggested weight loss and exercise and to return for recheck to determine if he would need a stress test he reports after that episode he did not have a recurrence until this morning.  Patient reports he has started to exercise.  He is walking about 25 minutes on the treadmill.  He reports he walks on the treadmill for 25 minutes yesterday and did not experience any exertional chest pain or shortness of breath.  He did not feel lightheaded or have near syncopal event.    Past Medical History:  Diagnosis Date  . Anxiety   . Chest pain   . GERD (gastroesophageal reflux disease)   . Palpitations   . Panic attack     There are no problems to display for this patient.   Past Surgical History:  Procedure Laterality Date  . KNEE SURGERY         Family History  Problem Relation Age of Onset  . Healthy Mother   . Healthy Father     Social History   Tobacco Use  . Smoking status: Never Smoker  . Smokeless  tobacco: Never Used  Vaping Use  . Vaping Use: Never used  Substance Use Topics  . Alcohol use: Not Currently    Comment: occ  . Drug use: Never    Home Medications Prior to Admission medications   Medication Sig Start Date End Date Taking? Authorizing Provider  omeprazole (PRILOSEC) 20 MG capsule Take 1 capsule (20 mg total) by mouth daily. 12/05/20  Yes Arby Barrette, MD  esomeprazole (NEXIUM) 20 MG capsule Take 20 mg by mouth daily at 12 noon.    [provider]    Allergies    Nitroglycerin, Iodinated diagnostic agents, Iodine, and Mushroom extract complex  Review of Systems   Review of Systems 10 systems reviewed and negative except as per HPI Physical Exam Updated Vital Signs BP 140/83   Pulse 65   Temp 98.7 F (37.1 C) (Oral)   Resp 18   SpO2 100%   Physical Exam Constitutional:      Comments: Alert nontoxic.  No acute distress.  Mental status clear.  HENT:     Head: Normocephalic and atraumatic.     Mouth/Throat:     Mouth: Mucous membranes are moist.     Pharynx: Oropharynx is clear.  Cardiovascular:     Rate and Rhythm: Normal rate and regular rhythm.  Pulmonary:     Effort: Pulmonary effort is normal.     Breath sounds: Normal breath sounds.  Abdominal:     General: There is no distension.     Palpations: Abdomen is soft.     Tenderness: There is no abdominal tenderness. There is no guarding.  Musculoskeletal:        General: No swelling or tenderness. Normal range of motion.     Cervical back: Neck supple.     Right lower leg: No edema.     Left lower leg: No edema.  Skin:    General: Skin is warm and dry.  Neurological:     General: No focal deficit present.     Mental Status: He is oriented to person, place, and time. Mental status is at baseline.     Cranial Nerves: No cranial nerve deficit.     Motor: No weakness.     Coordination: Coordination normal.  Psychiatric:        Mood and Affect: Mood normal.     ED Results /  Procedures / Treatments   Labs (all labs ordered are listed, but only abnormal results are displayed) Labs Reviewed  HEPATIC FUNCTION PANEL - Abnormal; Notable for the following components:      Result Value   Total Bilirubin 1.4 (*)    Bilirubin, Direct 0.3 (*)    Indirect Bilirubin 1.1 (*)    All other components within normal limits  LIPASE, BLOOD - Abnormal; Notable for the following components:   Lipase 80 (*)    All other components within normal limits  BASIC METABOLIC PANEL  CBC  TROPONIN I (HIGH SENSITIVITY)  TROPONIN I (HIGH SENSITIVITY)    EKG EKG Interpretation  Date/Time:  Tuesday Dec 05 2020 11:05:38 EDT Ventricular Rate:  60 PR Interval:  164 QRS Duration: 86 QT Interval:  394 QTC Calculation: 394 R Axis:   68 Text Interpretation: Normal sinus rhythm no sig change from previous Confirmed by Arby Barrette [phone]) on 12/05/2020 12:48:32 PM   Radiology DG Chest 2 View  Result Date: 12/05/2020 CLINICAL DATA:  Chest pain and tightness radiating to LEFT arm starting today, getting worse EXAM: CHEST - 2 VIEW COMPARISON:  08/06/2020 FINDINGS: Upper normal heart size. Mediastinal contours and pulmonary vascularity normal. Lungs clear. No acute infiltrate, pleural effusion, or pneumothorax. Osseous structures unremarkable. IMPRESSION: No acute abnormalities. Electronically Signed   By: Ulyses Southward M.D.   On: 12/05/2020 11:25    Procedures Procedures   Medications Ordered in ED Medications - No data to display  ED Course  I have reviewed the triage vital signs and the nursing notes.  Pertinent labs & imaging results that were available during my care of the patient were reviewed by me and considered in my medical decision making (see chart for details).    MDM Rules/Calculators/A&P                          Patient presents with chest pain that started this morning upon awakening.  At this time EKG stable without any acute ischemic appearance.  2 sets of  troponins normal.  Pain has resolved.  Patient has been seen by cardiology once previously for similar presentation.  At that time, suspected low for ACS.  At this time, I have some suspicion for gastroesophageal reflux disease.  Both times pain happened while lying supine in bed in the morning.  Patient does describe previously sporadically taking medications for  GERD.  He will take a 2-week course of PPI or Tums as needed.  At this time I recommend continuous PPI use for the next month and follow-up with PCP and cardiology.  Return precautions reviewed. Final Clinical Impression(s) / ED Diagnoses Final diagnoses:  Chest pain, unspecified type  Palpitations    Rx / DC Orders ED Discharge Orders         Ordered    omeprazole (PRILOSEC) 20 MG capsule  Daily        12/05/20 1627           Arby Barrette, MD 12/05/20 1634

## 2020-12-15 DIAGNOSIS — R1013 Epigastric pain: Secondary | ICD-10-CM | POA: Diagnosis not present

## 2020-12-19 ENCOUNTER — Other Ambulatory Visit: Payer: Self-pay | Admitting: Physician Assistant

## 2020-12-19 DIAGNOSIS — R195 Other fecal abnormalities: Secondary | ICD-10-CM | POA: Diagnosis not present

## 2020-12-19 DIAGNOSIS — R1013 Epigastric pain: Secondary | ICD-10-CM

## 2020-12-19 DIAGNOSIS — K219 Gastro-esophageal reflux disease without esophagitis: Secondary | ICD-10-CM

## 2021-01-12 ENCOUNTER — Other Ambulatory Visit: Payer: BLUE CROSS/BLUE SHIELD

## 2021-01-18 DIAGNOSIS — Z20822 Contact with and (suspected) exposure to covid-19: Secondary | ICD-10-CM | POA: Diagnosis not present

## 2021-01-19 ENCOUNTER — Encounter (HOSPITAL_BASED_OUTPATIENT_CLINIC_OR_DEPARTMENT_OTHER): Payer: Self-pay | Admitting: *Deleted

## 2021-01-19 ENCOUNTER — Emergency Department (HOSPITAL_BASED_OUTPATIENT_CLINIC_OR_DEPARTMENT_OTHER)
Admission: EM | Admit: 2021-01-19 | Discharge: 2021-01-19 | Disposition: A | Discharge status: Home / Self Care | Payer: 59 | Attending: Emergency Medicine | Admitting: Emergency Medicine

## 2021-01-19 ENCOUNTER — Other Ambulatory Visit: Payer: Self-pay

## 2021-01-19 ENCOUNTER — Emergency Department (HOSPITAL_BASED_OUTPATIENT_CLINIC_OR_DEPARTMENT_OTHER): Payer: 59

## 2021-01-19 DIAGNOSIS — R059 Cough, unspecified: Secondary | ICD-10-CM | POA: Diagnosis present

## 2021-01-19 DIAGNOSIS — Z28310 Unvaccinated for covid-19: Secondary | ICD-10-CM | POA: Diagnosis not present

## 2021-01-19 DIAGNOSIS — J40 Bronchitis, not specified as acute or chronic: Secondary | ICD-10-CM | POA: Diagnosis not present

## 2021-01-19 DIAGNOSIS — Z20822 Contact with and (suspected) exposure to covid-19: Secondary | ICD-10-CM | POA: Diagnosis not present

## 2021-01-19 LAB — RESP PANEL BY RT-PCR (FLU A&B, COVID) ARPGX2
Influenza A by PCR: NEGATIVE
Influenza B by PCR: NEGATIVE
SARS Coronavirus 2 by RT PCR: NEGATIVE

## 2021-01-19 MED ORDER — BENZONATATE 100 MG PO CAPS: 100.0000 mg | ORAL_CAPSULE | Freq: Three times a day (TID) | ORAL | 0 refills | Status: DC

## 2021-01-19 MED ORDER — HYDROCOD POLST-CPM POLST ER 10-8 MG/5ML PO SUER: 5.0000 mL | Freq: Every evening | ORAL | 0 refills | Status: DC | PRN

## 2021-01-19 NOTE — ED Triage Notes (Signed)
Shortness of breath around 1 am, Covid - 2 days ago and was tested again yesterday with no result yet.  Patient has not been feeling good in the past 2 weeks.

## 2021-01-19 NOTE — ED Provider Notes (Signed)
[facility] EMERGENCY DEPT Provider Note   CSN: [id number] Arrival date & time: 01/19/21  3428     History Chief Complaint  Patient presents with   Shortness of Breath    Jesse Mckinney is a 35 y.o. male.  Jesse Mckinney has had a respiratory illness for the past 2 weeks.  This morning he felt a little bit short of breath while ambulating to the bathroom, and he called the ambulance.  The history is provided by the patient.  URI Presenting symptoms: congestion, cough and sore throat   Presenting symptoms: no ear pain and no fever   Severity:  Moderate Onset quality:  Gradual Duration:  2 weeks Timing:  Constant Progression:  Worsening Chronicity:  New Relieved by:  Nothing Worsened by:  Nothing Ineffective treatments:  None tried Associated symptoms: no arthralgias and no headaches   Risk factors comment:  Unvaccinated for COVID, recently traveled to [place]     Past Medical History:  Diagnosis Date   Anxiety    Chest pain    GERD (gastroesophageal reflux disease)    Palpitations    Panic attack     There are no problems to display for this patient.   Past Surgical History:  Procedure Laterality Date   KNEE SURGERY         Family History  Problem Relation Age of Onset   Healthy Mother    Healthy Father     Social History   Tobacco Use   Smoking status: Never   Smokeless tobacco: Never  Vaping Use   Vaping Use: Never used  Substance Use Topics   Alcohol use: Yes    Comment: occ   Drug use: Never    Home Medications Prior to Admission medications   Medication Sig Start Date End Date Taking? Authorizing Provider  esomeprazole (NEXIUM) 20 MG capsule Take 20 mg by mouth daily at 12 noon.   Yes [provider]    Allergies    Nitroglycerin, Iodinated diagnostic agents, Iodine, and Mushroom extract complex  Review of Systems   Review of Systems  Constitutional:  Negative for chills and fever.  HENT:  Positive for  congestion and sore throat. Negative for ear pain.   Eyes:  Negative for pain and visual disturbance.  Respiratory:  Positive for cough and shortness of breath.   Cardiovascular:  Negative for chest pain and palpitations.  Gastrointestinal:  Negative for abdominal pain and vomiting.  Genitourinary:  Negative for dysuria and hematuria.  Musculoskeletal:  Negative for arthralgias and back pain.  Skin:  Negative for color change and rash.  Neurological:  Negative for seizures, syncope and headaches.  All other systems reviewed and are negative.  Physical Exam Updated Vital Signs BP 134/69 (BP Location: Left Arm)   Pulse (!) 7   Temp 98.5 F (36.9 C) (Oral)   Resp 16   Ht 6\' 6"  (1.981 m)   Wt (!) 190.5 kg   SpO2 99%   BMI 48.54 kg/m   Physical Exam Vitals and nursing note reviewed.  Constitutional:      Appearance: Normal appearance.  HENT:     Head: Normocephalic and atraumatic.  Eyes:     Conjunctiva/sclera: Conjunctivae normal.  Cardiovascular:     Rate and Rhythm: Normal rate and regular rhythm.  Pulmonary:     Effort: Pulmonary effort is normal. No respiratory distress.     Breath sounds: Normal breath sounds.  Musculoskeletal:        General: No deformity.  Normal range of motion.     Cervical back: Normal range of motion.  Skin:    General: Skin is warm and dry.  Neurological:     General: No focal deficit present.     Mental Status: He is alert and oriented to person, place, and time. Mental status is at baseline.  Psychiatric:        Mood and Affect: Mood normal.    ED Results / Procedures / Treatments   Labs (all labs ordered are listed, but only abnormal results are displayed) Labs Reviewed  RESP PANEL BY RT-PCR (FLU A&B, COVID) [id number]    EKG EKG Interpretation  Date/Time:  Friday January 19 2021 07:32:18 EDT Ventricular Rate:  66 PR Interval:  167 QRS Duration: 90 QT Interval:  383 QTC Calculation: 402 R Axis:   70 Text Interpretation: Sinus  rhythm Low voltage, precordial leads Borderline T abnormalities, inferior leads Baseline wander in lead(s) V6 similar to prior No acute ischemia Confirmed by Pieter Partridge ([id number]) on 01/19/2021 8:06:53 AM  Radiology DG Chest Port 1 View  Result Date: 01/19/2021 CLINICAL DATA:  Shortness of breath. EXAM: PORTABLE CHEST 1 VIEW COMPARISON:  12/05/2020 FINDINGS: 0757 hours. The lungs are clear without focal pneumonia, edema, pneumothorax or pleural effusion. The cardiopericardial silhouette is within normal limits for size. The visualized bony structures of the thorax show no acute abnormality. Telemetry leads overlie the chest. IMPRESSION: No active disease. Electronically Signed   By: Kennith Center M.D.   On: 01/19/2021 08:23    Procedures Procedures   Medications Ordered in ED Medications - No data to display  ED Course  I have reviewed the triage vital signs and the nursing notes.  Pertinent labs & imaging results that were available during my care of the patient were reviewed by me and considered in my medical decision making (see chart for details).    MDM Rules/Calculators/A&P                          Jesse Mckinney presents with a lower respiratory illness.  Vital signs are within normal limits.  Physical exam normal.  EKG, chest x-ray, normal.  No evidence of COVID-19.  He will be discharged home with recommendations for ongoing symptomatic management. Final Clinical Impression(s) / ED Diagnoses Final diagnoses:  Bronchitis    Rx / DC Orders ED Discharge Orders          Ordered    benzonatate (TESSALON) 100 MG capsule  Every 8 hours        01/19/21 0904    chlorpheniramine-HYDROcodone (TUSSIONEX PENNKINETIC ER) 10-8 MG/5ML SUER  At bedtime PRN        01/19/21 0904             Koleen Distance, MD 01/19/21 [phone]

## 2021-01-19 NOTE — ED Notes (Signed)
X-ray at bedside

## 2021-01-31 ENCOUNTER — Other Ambulatory Visit: Payer: BLUE CROSS/BLUE SHIELD

## 2021-02-07 ENCOUNTER — Emergency Department (HOSPITAL_BASED_OUTPATIENT_CLINIC_OR_DEPARTMENT_OTHER)
Admission: EM | Admit: 2021-02-07 | Discharge: 2021-02-07 | Disposition: A | Discharge status: Home / Self Care | Payer: BLUE CROSS/BLUE SHIELD | Attending: Emergency Medicine | Admitting: Emergency Medicine

## 2021-02-07 ENCOUNTER — Other Ambulatory Visit: Payer: Self-pay

## 2021-02-07 ENCOUNTER — Encounter (HOSPITAL_BASED_OUTPATIENT_CLINIC_OR_DEPARTMENT_OTHER): Payer: Self-pay | Admitting: Emergency Medicine

## 2021-02-07 ENCOUNTER — Emergency Department (HOSPITAL_BASED_OUTPATIENT_CLINIC_OR_DEPARTMENT_OTHER): Payer: BLUE CROSS/BLUE SHIELD

## 2021-02-07 DIAGNOSIS — R070 Pain in throat: Secondary | ICD-10-CM | POA: Diagnosis not present

## 2021-02-07 DIAGNOSIS — R0789 Other chest pain: Secondary | ICD-10-CM | POA: Diagnosis not present

## 2021-02-07 DIAGNOSIS — J209 Acute bronchitis, unspecified: Secondary | ICD-10-CM | POA: Insufficient documentation

## 2021-02-07 DIAGNOSIS — R079 Chest pain, unspecified: Secondary | ICD-10-CM | POA: Diagnosis not present

## 2021-02-07 DIAGNOSIS — R059 Cough, unspecified: Secondary | ICD-10-CM | POA: Diagnosis not present

## 2021-02-07 DIAGNOSIS — Z20822 Contact with and (suspected) exposure to covid-19: Secondary | ICD-10-CM | POA: Diagnosis not present

## 2021-02-07 DIAGNOSIS — I10 Essential (primary) hypertension: Secondary | ICD-10-CM | POA: Diagnosis not present

## 2021-02-07 DIAGNOSIS — R0981 Nasal congestion: Secondary | ICD-10-CM | POA: Diagnosis present

## 2021-02-07 LAB — RESP PANEL BY RT-PCR (FLU A&B, COVID) ARPGX2
Influenza A by PCR: NEGATIVE
Influenza B by PCR: NEGATIVE
SARS Coronavirus 2 by RT PCR: NEGATIVE

## 2021-02-07 MED ORDER — ALBUTEROL SULFATE HFA 108 (90 BASE) MCG/ACT IN AERS
2.0000 | INHALATION_SPRAY | RESPIRATORY_TRACT | Status: DC | PRN
  Filled 2021-02-07: qty 6.7

## 2021-02-07 NOTE — ED Provider Notes (Signed)
[facility] Provider Note: Lowella Dell, MD, FACEP  CSN: [id number] MRN: [id number] ARRIVAL: 02/07/21 at 0248 ROOM: MH09/MH09   CHIEF COMPLAINT  URI   HISTORY OF PRESENT ILLNESS  02/07/21 4:37 AM Jesse Mckinney is a 35 y.o. male who was seen in the ED and treated for bronchitis on 01/19/2021.  He was given benzonatate and Tussionex.  He tested negative for COVID at that time.  He is here with a return of symptoms, specifically nasal congestion, sore throat, worsening of his persistent cough, shortness of breath and chest tightness.  He has not had a fever.  Shortness of breath and chest tightness are worse with exertion.   Past Medical History:  Diagnosis Date   Anxiety    Chest pain    GERD (gastroesophageal reflux disease)    Palpitations    Panic attack     Past Surgical History:  Procedure Laterality Date   KNEE SURGERY      Family History  Problem Relation Age of Onset   Healthy Mother    Healthy Father     Social History   Tobacco Use   Smoking status: Never   Smokeless tobacco: Never  Vaping Use   Vaping Use: Never used  Substance Use Topics   Alcohol use: Yes    Comment: occ   Drug use: Never    Prior to Admission medications   Medication Sig Start Date End Date Taking? Authorizing Provider  benzonatate (TESSALON) 100 MG capsule Take 1 capsule (100 mg total) by mouth every 8 (eight) hours. 01/19/21   Koleen Distance, MD  chlorpheniramine-HYDROcodone [facility] PENNKINETIC ER) 10-8 MG/5ML SUER Take 5 mLs by mouth at bedtime as needed for cough. 01/19/21   Koleen Distance, MD  esomeprazole (NEXIUM) 20 MG capsule Take 20 mg by mouth daily at 12 noon.    [provider]    Allergies Nitroglycerin, Iodinated diagnostic agents, Iodine, and Mushroom extract complex   REVIEW OF SYSTEMS  Negative except as noted here or in the History of Present Illness.   PHYSICAL EXAMINATION  Initial Vital Signs Blood pressure 125/83, pulse 65,  temperature 98.1 F (36.7 C), temperature source Oral, resp. rate 17, height 6\' 5"  (1.956 m), weight (!) 190.5 kg, SpO2 100 %.  Examination General: Well-developed, high BMI male in no acute distress; appearance consistent with age of record HENT: normocephalic; atraumatic Eyes: pupils equal, round and reactive to light; extraocular muscles intact Neck: supple Heart: regular rate and rhythm Lungs: clear to auscultation bilaterally; occasional dry cough Abdomen: soft; nondistended; nontender; bowel sounds present Extremities: No deformity; full range of motion; pulses normal Neurologic: Awake, alert and oriented; motor function intact in all extremities and symmetric; no facial droop Skin: Warm and dry Psychiatric: Normal mood and affect   RESULTS  Summary of this visit's results, reviewed and interpreted by myself:   EKG Interpretation  Date/Time:  Wednesday February 07 2021 03:13:15 EDT Ventricular Rate:  73 PR Interval:  164 QRS Duration: 84 QT Interval:  360 QTC Calculation: 397 R Axis:   62 Text Interpretation: Sinus rhythm Inferior infarct, age indeterminate No significant change was found Confirmed by [id number] (Paula Libra) on 02/07/2021 3:45:50 AM        Laboratory Studies: Results for orders placed or performed during the hospital encounter of 02/07/21 (from the past 24 hour(s))  Resp Panel by RT-PCR (Flu A&B, Covid) Nasopharyngeal Swab     Status: None   Collection Time: 02/07/21  4:47 AM  Specimen: Nasopharyngeal Swab; Nasopharyngeal(NP) swabs in vial transport medium  Result Value Ref Range   SARS Coronavirus 2 by RT PCR NEGATIVE NEGATIVE   Influenza A by PCR NEGATIVE NEGATIVE   Influenza B by PCR NEGATIVE NEGATIVE   Imaging Studies: DG Chest Portable 1 View  Result Date: 02/07/2021 CLINICAL DATA:  Sore throat, shortness of breath and chest tightness EXAM: PORTABLE CHEST 1 VIEW COMPARISON:  Radiograph 01/19/2021 FINDINGS: Body habitus resulting in increased  attenuation across the chest and diminished resolution. Accounting for body habitus, the lungs are clear. No consolidation, features of edema, pneumothorax, or effusion. Pulmonary vascularity is normally distributed. The cardiomediastinal contours are unremarkable. No acute osseous or soft tissue abnormality. IMPRESSION: No acute cardiopulmonary abnormality. Electronically Signed   By: Kreg Shropshire M.D.   On: 02/07/2021 03:28    ED COURSE and MDM  Nursing notes, initial and subsequent vitals signs, including pulse oximetry, reviewed and interpreted by myself.  Vitals:   02/07/21 0311 02/07/21 0313 02/07/21 0400  BP: 126/88  125/83  Pulse: 78  65  Resp: 19  17  Temp: 98.1 F (36.7 C)    TempSrc: Oral    SpO2: 99%  100%  Weight:  (!) 190.5 kg   Height:  6\' 5"  (1.956 m)    Medications  albuterol (VENTOLIN HFA) 108 (90 Base) MCG/ACT inhaler 2 puff (has no administration in time range)   5:40 AM Patient feels better after albuterol treatment.  Patient given inhaler and AeroChamber and instructed in their use.  COVID is negative.   PROCEDURES  Procedures   ED DIAGNOSES     ICD-10-CM   1. Acute bronchitis with bronchospasm  J20.9     2. COVID-19 virus not detected  Z20.822          , MD 02/07/21 [phone]

## 2021-02-07 NOTE — ED Notes (Signed)
EDP at bedside  

## 2021-02-07 NOTE — ED Triage Notes (Addendum)
Pt BIB EMS, charge RN given report from them; pt reports recently diagnosed with bronchitis; reports Wynelle Link he developed sore throat; pt has had cough x 2 weeks and this AM has SHOB and "chest tightness"; pt is able to speak in full sentences without difficulty; pt is ambulatory

## 2021-03-17 DIAGNOSIS — Z630 Problems in relationship with spouse or partner: Secondary | ICD-10-CM | POA: Diagnosis not present

## 2021-03-17 DIAGNOSIS — R69 Illness, unspecified: Secondary | ICD-10-CM | POA: Diagnosis not present
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
## 2021-04-19 ENCOUNTER — Other Ambulatory Visit: Payer: Self-pay

## 2021-04-19 ENCOUNTER — Emergency Department (HOSPITAL_COMMUNITY)
Admission: EM | Admit: 2021-04-19 | Discharge: 2021-04-19 | Disposition: A | Discharge status: Home / Self Care | Payer: 59 | Attending: Emergency Medicine | Admitting: Emergency Medicine

## 2021-04-19 ENCOUNTER — Encounter (HOSPITAL_COMMUNITY): Payer: Self-pay

## 2021-04-19 DIAGNOSIS — R059 Cough, unspecified: Secondary | ICD-10-CM | POA: Diagnosis present

## 2021-04-19 DIAGNOSIS — U071 COVID-19: Secondary | ICD-10-CM | POA: Insufficient documentation

## 2021-04-19 DIAGNOSIS — Z5321 Procedure and treatment not carried out due to patient leaving prior to being seen by health care provider: Secondary | ICD-10-CM | POA: Diagnosis not present

## 2021-04-19 DIAGNOSIS — M79602 Pain in left arm: Secondary | ICD-10-CM | POA: Diagnosis not present

## 2021-04-19 LAB — RESP PANEL BY RT-PCR (FLU A&B, COVID) ARPGX2
Influenza A by PCR: NEGATIVE
Influenza B by PCR: NEGATIVE
SARS Coronavirus 2 by RT PCR: POSITIVE — AB

## 2021-04-19 NOTE — ED Triage Notes (Signed)
Pt BIB EMS; pt complains of left arm pain x 2 months. Pt has a cough, fever, and congestion that started today.

## 2021-04-22 DIAGNOSIS — Z20822 Contact with and (suspected) exposure to covid-19: Secondary | ICD-10-CM | POA: Diagnosis not present

## 2021-05-12 DIAGNOSIS — R69 Illness, unspecified: Secondary | ICD-10-CM | POA: Diagnosis not present

## 2021-05-12 DIAGNOSIS — Z630 Problems in relationship with spouse or partner: Secondary | ICD-10-CM | POA: Diagnosis not present

## 2021-05-15 DIAGNOSIS — Z131 Encounter for screening for diabetes mellitus: Secondary | ICD-10-CM | POA: Diagnosis not present

## 2021-05-15 DIAGNOSIS — Z6841 Body Mass Index (BMI) 40.0 and over, adult: Secondary | ICD-10-CM | POA: Diagnosis not present

## 2021-05-15 DIAGNOSIS — E559 Vitamin D deficiency, unspecified: Secondary | ICD-10-CM | POA: Diagnosis not present

## 2021-05-15 DIAGNOSIS — R69 Illness, unspecified: Secondary | ICD-10-CM | POA: Diagnosis not present

## 2021-05-15 DIAGNOSIS — E291 Testicular hypofunction: Secondary | ICD-10-CM | POA: Diagnosis not present

## 2021-05-15 DIAGNOSIS — M79602 Pain in left arm: Secondary | ICD-10-CM | POA: Diagnosis not present

## 2021-05-15 DIAGNOSIS — Z13220 Encounter for screening for lipoid disorders: Secondary | ICD-10-CM | POA: Diagnosis not present

## 2021-05-15 DIAGNOSIS — K219 Gastro-esophageal reflux disease without esophagitis: Secondary | ICD-10-CM | POA: Diagnosis not present

## 2021-05-15 DIAGNOSIS — Z0000 Encounter for general adult medical examination without abnormal findings: Secondary | ICD-10-CM | POA: Diagnosis not present

## 2021-05-22 DIAGNOSIS — Z20822 Contact with and (suspected) exposure to covid-19: Secondary | ICD-10-CM | POA: Diagnosis not present

## 2021-05-26 DIAGNOSIS — R69 Illness, unspecified: Secondary | ICD-10-CM | POA: Diagnosis not present

## 2021-05-26 DIAGNOSIS — Z630 Problems in relationship with spouse or partner: Secondary | ICD-10-CM | POA: Diagnosis not present

## 2021-06-09 DIAGNOSIS — R69 Illness, unspecified: Secondary | ICD-10-CM | POA: Diagnosis not present

## 2021-06-09 DIAGNOSIS — Z630 Problems in relationship with spouse or partner: Secondary | ICD-10-CM | POA: Diagnosis not present

## 2021-07-07 DIAGNOSIS — R69 Illness, unspecified: Secondary | ICD-10-CM | POA: Diagnosis not present

## 2021-07-07 DIAGNOSIS — Z630 Problems in relationship with spouse or partner: Secondary | ICD-10-CM | POA: Diagnosis not present

## 2021-09-15 DIAGNOSIS — Z20822 Contact with and (suspected) exposure to covid-19: Secondary | ICD-10-CM | POA: Diagnosis not present

## 2021-09-27 DIAGNOSIS — K219 Gastro-esophageal reflux disease without esophagitis: Secondary | ICD-10-CM | POA: Diagnosis not present

## 2021-09-27 DIAGNOSIS — R0789 Other chest pain: Secondary | ICD-10-CM | POA: Diagnosis not present

## 2021-09-27 DIAGNOSIS — R0602 Shortness of breath: Secondary | ICD-10-CM | POA: Diagnosis not present

## 2021-10-01 DIAGNOSIS — R69 Illness, unspecified: Secondary | ICD-10-CM | POA: Diagnosis not present

## 2021-10-02 ENCOUNTER — Telehealth: Payer: Self-pay

## 2021-10-02 NOTE — Telephone Encounter (Signed)
NOTES SCANNED TO REFERRAL 

## 2021-10-06 NOTE — Progress Notes (Unsigned)
Cardiology Office Note:    Date:  10/06/2021   ID:  Jesse Mckinney, DOB [birth year], MRN OF:[id number]  PCP:  Patient, No Pcp Per (Inactive)   [facility] Providers Cardiologist:  None { Click to update primary MD,subspecialty MD or APP then REFRESH:1}    Referring MD: Eilene Ghazi, NP   No chief complaint on file. ***  History of Present Illness:    Jesse Mckinney is a 36 y.o. male with a hx of chest pain, palpitations,   He established care with our group on 07/12/20 and was evaluated by Dr. Tamala Julian for chest pain that he reported was worse when lying on his left side. He had previously been evaluated by [facility] for same and echo June 2021 revealed normal LVEF, mild LVH, normal diastolic function, normal RV, and trivial valvular abnormalities. Cardiac monitor (3 day) revealed predominant NSR with maximum HR 117 bpm and minimum 44 bpm. He had 10 PVCs with a burden of < 0.01%, zero patient triggered events. Dr. Tamala Julian ordered a 30 day monitor which revealed low burden of PACs and PVCs, poor correlation between symptoms of flutter, chest tightness and weakness with arrhythmia. Perhaps PVCs were present 20% of the time in association with complaints. No atrial fibrillation, no VT.   Today, he is here   Past Medical History:  Diagnosis Date   Anxiety    Chest pain    GERD (gastroesophageal reflux disease)    Palpitations    Panic attack     Past Surgical History:  Procedure Laterality Date   KNEE SURGERY      Current Medications: No outpatient medications have been marked as taking for the 10/09/21 encounter (Appointment) with Ann Maki, Lanice Schwab, NP.     Allergies:   Nitroglycerin, Iodinated contrast media, Iodine, and Mushroom extract complex   Social History   Socioeconomic History   Marital status: Married    Spouse name: Not on file   Number of children: Not on file   Years of education: Not on file   Highest education level: Not on file  Occupational  History   Not on file  Tobacco Use   Smoking status: Never   Smokeless tobacco: Never  Vaping Use   Vaping Use: Never used  Substance and Sexual Activity   Alcohol use: Yes    Comment: occ   Drug use: Never   Sexual activity: Not on file  Other Topics Concern   Not on file  Social History Narrative   Not on file   Social Determinants of Health   Financial Resource Strain: Not on file  Food Insecurity: Not on file  Transportation Needs: Not on file  Physical Activity: Not on file  Stress: Not on file  Social Connections: Not on file     Family History: The patient's ***family history includes Healthy in his father and mother.  ROS:   Please see the history of present illness.    *** All other systems reviewed and are negative.  Labs/Other Studies Reviewed:    The following studies were reviewed today: ***  Recent Labs: 12/05/2020: ALT 22; BUN 14; Creatinine, Ser 0.90; Hemoglobin 15.4; Platelets 174; Potassium 4.5; Sodium 138  Recent Lipid Panel No results found for: CHOL, TRIG, HDL, CHOLHDL, VLDL, LDLCALC, LDLDIRECT   Risk Assessment/Calculations:   {Does this patient have ATRIAL FIBRILLATION?:[phone]}       Physical Exam:    VS:  There were no vitals taken for this visit.    Wt Readings from  Last 3 Encounters:  04/19/21 (!) 420 lb (190.5 kg)  02/07/21 (!) 420 lb (190.5 kg)  01/19/21 (!) 420 lb (190.5 kg)     GEN: *** Well nourished, well developed in no acute distress HEENT: Normal NECK: No JVD; No carotid bruits CARDIAC: ***RRR, no murmurs, rubs, gallops RESPIRATORY:  Clear to auscultation without rales, wheezing or rhonchi  ABDOMEN: Soft, non-tender, non-distended MUSCULOSKELETAL:  No edema; No deformity. *** pedal pulses, ***bilaterally SKIN: Warm and dry NEUROLOGIC:  Alert and oriented x 3 PSYCHIATRIC:  Normal affect   EKG:  EKG is *** ordered today.  The ekg ordered today demonstrates ***  Diagnoses:    No diagnosis found. Assessment  and Plan:     ***          {Are you ordering a CV Procedure (e.g. stress test, cath, DCCV, TEE, etc)?   Press F2        :[id number]    Medication Adjustments/Labs and Tests Ordered: Current medicines are reviewed at length with the patient today.  Concerns regarding medicines are outlined above.  No orders of the defined types were placed in this encounter.  No orders of the defined types were placed in this encounter.   There are no Patient Instructions on file for this visit.   Signed, Emmaline Life, NP  10/06/2021 11:51 AM    [place]

## 2021-10-09 ENCOUNTER — Encounter: Payer: Self-pay | Admitting: Nurse Practitioner

## 2021-10-09 ENCOUNTER — Other Ambulatory Visit: Payer: Self-pay

## 2021-10-09 ENCOUNTER — Ambulatory Visit (INDEPENDENT_AMBULATORY_CARE_PROVIDER_SITE_OTHER): Payer: 59 | Admitting: Nurse Practitioner

## 2021-10-09 VITALS — BP 110/72 | HR 63 | Ht 77.0 in | Wt >= 6400 oz

## 2021-10-09 DIAGNOSIS — R002 Palpitations: Secondary | ICD-10-CM

## 2021-10-09 DIAGNOSIS — R0602 Shortness of breath: Secondary | ICD-10-CM

## 2021-10-09 DIAGNOSIS — R079 Chest pain, unspecified: Secondary | ICD-10-CM | POA: Diagnosis not present

## 2021-10-09 LAB — TSH: TSH: 0.753 u[IU]/mL (ref 0.450–4.500)

## 2021-10-09 MED ORDER — PREDNISONE 50 MG PO TABS: ORAL_TABLET | ORAL | 0 refills | Status: DC

## 2021-10-09 MED ORDER — METOPROLOL TARTRATE 100 MG PO TABS: 100.0000 mg | ORAL_TABLET | Freq: Once | ORAL | 0 refills | Status: DC

## 2021-10-09 MED ORDER — METOPROLOL SUCCINATE ER 25 MG PO TB24: 25.0000 mg | ORAL_TABLET | Freq: Every day | ORAL | 11 refills | Status: DC

## 2021-10-09 NOTE — Patient Instructions (Signed)
Medication Instructions:  ? ?START Toprol one (1) tablet by mouth ( 25 mg ) at bedtime. ? ?*If you need a refill on your cardiac medications before your next appointment, please call your pharmacy* ? ? ?Lab Work: ? ?TODAY!!!!  TSH ? ?If you have labs (blood work) drawn today and your tests are completely normal, you will receive your results only by: ?MyChart Message (if you have MyChart) OR ?A paper copy in the mail ?If you have any lab test that is abnormal or we need to change your treatment, we will call you to review the results. ? ? ?Testing/Procedures: ? ? ?Your physician has requested that you have an echocardiogram. Echocardiography is a painless test that uses sound waves to create images of your heart. It provides your doctor with information about the size and shape of your heart and how well your heart?s chambers and valves are working. This procedure takes approximately one hour. There are no restrictions for this procedure. ? ? ? ? ?Your cardiac CT will be scheduled at one of the below locations:  ? ?[facility] ?[address] ?[place], [place] [address] ?[phone] ? ?If scheduled at [facility], please arrive at the [facility] Entrance (Entrance C2) of [facility] 30 minutes prior to test start time. ?You can use the FREE valet parking offered at entrance C (encouraged to control the heart rate for the test)  ?Proceed to the [facility] Radiology Department (first floor) to check-in and test prep. ? ?All radiology patients and guests should use entrance C2 at [facility], accessed from [facility], even though the hospital's physical address listed is [address] ? ? ? ?If scheduled at [facility], please arrive 15 mins early for check-in and test prep. ? ?Please follow these instructions carefully (unless otherwise directed): ? ?Hold all erectile dysfunction medications at least 3 days (72 hrs)  prior to test. ? ?On the Night Before the Test: ?Be sure to Drink plenty of water. ?Do not consume any caffeinated/decaffeinated beverages or chocolate 12 hours prior to your test. ?Do not take any antihistamines 12 hours prior to your test. ?If the patient has contrast allergy: ?Patient will need a prescription for Prednisone and very clear instructions (as follows): ?Prednisone 50 mg - take 13 hours prior to test ?Take another Prednisone 50 mg 7 hours prior to test ?Take another Prednisone 50 mg 1 hour prior to test ?Take Benadryl 50 mg 1 hour prior to test ?Patient must complete all four doses of above prophylactic medications. ?Patient will need a ride after test due to Benadryl. ? ?On the Day of the Test: ?Drink plenty of water until 1 hour prior to the test. ?Do not eat any food 4 hours prior to the test. ?You may take your regular medications prior to the test.  ?Take metoprolol (Lopressor) two hours prior to test. ? ?    ?After the Test: ?Drink plenty of water. ?After receiving IV contrast, you may experience a mild flushed feeling. This is normal. ?On occasion, you may experience a mild rash up to 24 hours after the test. This is not dangerous. If this occurs, you can take Benadryl 25 mg and increase your fluid intake. ?If you experience trouble breathing, this can be serious. If it is severe call 911 IMMEDIATELY. If it is mild, please call our office. ? ? ?We will call to schedule your test 2-4 weeks out understanding that some  insurance companies will need an authorization prior to the service being performed.  ? ?For non-scheduling related questions, please contact the cardiac imaging nurse navigator should you have any questions/concerns: ?Marchia Bond, Cardiac Imaging Nurse Navigator ?Gordy Clement, Cardiac Imaging Nurse Navigator ?[facility] ?Direct Office Dial: [phone]  ? ?For scheduling needs, including cancellations and rescheduling, please call [place],  [phone].  ? ? ?Follow-Up: ?At [facility], you and your health needs are our priority.  As part of our continuing mission to provide you with exceptional heart care, we have created designated Provider Care Teams.  These Care Teams include your primary Cardiologist (physician) and Advanced Practice Providers (APPs -  Physician Assistants and Nurse Practitioners) who all work together to provide you with the care you need, when you need it. ? ?We recommend signing up for the patient portal called "MyChart".  Sign up information is provided on this After Visit Summary.  MyChart is used to connect with patients for Virtual Visits (Telemedicine).  Patients are able to view lab/test results, encounter notes, upcoming appointments, etc.  Non-urgent messages can be sent to your provider as well.   ?To learn more about what you can do with MyChart, go to [email or web address].   ? ?Your next appointment:   ?2 month(s) ? ?The format for your next appointment:   ?In Person ? ?Provider:   ?Christen Bame, NP       ? ? ?

## 2021-10-13 DIAGNOSIS — Z20822 Contact with and (suspected) exposure to covid-19: Secondary | ICD-10-CM | POA: Diagnosis not present

## 2021-10-15 ENCOUNTER — Other Ambulatory Visit: Payer: Self-pay

## 2021-10-15 ENCOUNTER — Ambulatory Visit (HOSPITAL_COMMUNITY): Payer: 59 | Attending: Cardiology

## 2021-10-15 DIAGNOSIS — R0609 Other forms of dyspnea: Secondary | ICD-10-CM | POA: Diagnosis not present

## 2021-10-15 DIAGNOSIS — R079 Chest pain, unspecified: Secondary | ICD-10-CM | POA: Diagnosis not present

## 2021-10-15 LAB — ECHOCARDIOGRAM COMPLETE
Area-P 1/2: 4.52 cm2
S' Lateral: 2.8 cm

## 2021-10-15 NOTE — Progress Notes (Unsigned)
Per order, I did not administer Perflutren (Definity), due to known hypersensitivity/allergy. ?

## 2021-10-17 ENCOUNTER — Telehealth (HOSPITAL_COMMUNITY): Payer: Self-pay | Admitting: Emergency Medicine

## 2021-10-17 ENCOUNTER — Encounter (HOSPITAL_COMMUNITY): Payer: Self-pay

## 2021-10-17 DIAGNOSIS — K219 Gastro-esophageal reflux disease without esophagitis: Secondary | ICD-10-CM | POA: Diagnosis not present

## 2021-10-17 DIAGNOSIS — R69 Illness, unspecified: Secondary | ICD-10-CM | POA: Diagnosis not present

## 2021-10-17 DIAGNOSIS — R079 Chest pain, unspecified: Secondary | ICD-10-CM | POA: Diagnosis not present

## 2021-10-17 NOTE — Telephone Encounter (Signed)
Reaching out to patient to offer assistance regarding upcoming cardiac imaging study; pt verbalizes understanding of appt date/time, parking situation and where to check in, pre-test NPO status and medications ordered, and verified current allergies; name and call back number provided for further questions should they arise ?Marchia Bond RN Navigator Cardiac Imaging ?[facility] ?[phone] office ?[phone] cell ? ?Denies iv issues ?13 hr prep+ 100mg  metoprolol tartrate ?Pt aware that he is getting contrast and nitro for exam - wishes to proceed ?Arrival 830 ?

## 2021-10-18 NOTE — Telephone Encounter (Signed)
Call placed to pt.  He was a little confused as the Lopressor 100 mg had 2 different sigs on it.  He was advised that he would only take that for a 1 time dose, 2 hours prior to his CT.  He was made aware to monitor his heart rate when he is due to take his nightly dose, if it was lower than 60, he can skip that dose and resume it the following evening. ? ?Him and his mom, both, verbalized understanding and was thankful for the clarification.  ?

## 2021-10-19 ENCOUNTER — Other Ambulatory Visit: Payer: Self-pay

## 2021-10-19 ENCOUNTER — Ambulatory Visit (HOSPITAL_COMMUNITY)
Admission: RE | Admit: 2021-10-19 | Discharge: 2021-10-19 | Disposition: A | Discharge status: Home / Self Care | Payer: 59 | Source: Ambulatory Visit | Attending: Nurse Practitioner | Admitting: Nurse Practitioner

## 2021-10-19 DIAGNOSIS — R079 Chest pain, unspecified: Secondary | ICD-10-CM | POA: Insufficient documentation

## 2021-10-19 MED ORDER — IOHEXOL 350 MG/ML SOLN
125.0000 mL | Freq: Once | INTRAVENOUS | Status: AC | PRN
  Administered 2021-10-19: 125 mL via INTRAVENOUS

## 2021-10-19 MED ORDER — NITROGLYCERIN 0.4 MG SL SUBL
SUBLINGUAL_TABLET | SUBLINGUAL | Status: AC
  Filled 2021-10-19: qty 2

## 2021-10-19 MED ORDER — METOPROLOL TARTRATE 5 MG/5ML IV SOLN
INTRAVENOUS | Status: AC
  Filled 2021-10-19: qty 10

## 2021-10-19 MED ORDER — METOPROLOL TARTRATE 5 MG/5ML IV SOLN
10.0000 mg | Freq: Once | INTRAVENOUS | Status: AC
  Administered 2021-10-19: 10 mg via INTRAVENOUS

## 2021-10-19 MED ORDER — NITROGLYCERIN 0.4 MG SL SUBL
0.8000 mg | SUBLINGUAL_TABLET | Freq: Once | SUBLINGUAL | Status: AC
  Administered 2021-10-19: 0.8 mg via SUBLINGUAL

## 2021-10-19 NOTE — Progress Notes (Signed)
Patient tolerated CT well. Stated sleepy from premedications. Vital signs stable. Assisted patient to exit via wheelchair with wife.  ?

## 2021-11-12 DIAGNOSIS — E291 Testicular hypofunction: Secondary | ICD-10-CM | POA: Diagnosis not present

## 2021-11-12 DIAGNOSIS — R69 Illness, unspecified: Secondary | ICD-10-CM | POA: Diagnosis not present

## 2021-12-06 NOTE — Progress Notes (Signed)
?Cardiology Office Note:   ? ?Date:  12/10/2021  ? ?ID:  Jesse Mckinney, DOB [birth year], MRN [id number] ? ?PCP:  Default, Provider, MD ?  ?[facility] Providers ?Cardiologist:  Lesleigh Noe, MD    ? ?Referring MD: No ref. provider found  ? ?Chief Complaint: follow-up palpitations ? ?History of Present Illness:   ? ?Jesse Mckinney is a 36 y.o. male with a hx of chest pain, obesity, and palpitations.  ? ?He established care with our group on 07/12/20 and was evaluated by Dr. Katrinka Blazing for chest pain that he reported was worse when lying on his left side. He had previously been evaluated by [facility] for same and echo June 2021 revealed normal LVEF, mild LVH, normal diastolic function, normal RV, and trivial valvular abnormalities. Cardiac monitor (3 day) revealed predominant NSR with maximum HR 117 bpm and minimum 44 bpm. He had 10 PVCs with a burden of < 0.01%, zero patient triggered events. Dr. Katrinka Blazing ordered a 30 day monitor which revealed low burden of PACs and PVCs, poor correlation between symptoms of flutter, chest tightness and weakness with arrhythmia. Perhaps PVCs were present 20% of the time in association with complaints. No atrial fibrillation, no VT.  ? ?He was last seen in our office by me on 10/09/21 for evaluation of chest discomfort and shortness of breath. Waking up in the morning "feeling winded." Had recent sleep study that revealed 8 episodes of sleep apnea that did not require treatment.  Works sedentary job and not working out at gym due to concern about symptoms. Noticed worsening symptoms with exertion. Pain increased when lying on left side but not every night. Recently started Protonix and notices less burning in throat however chest feels the same. Working on better diet to lose weight and wants to return to exercising. Has [employer] membership. From his description, he continues to eat a lot of rice and potatoes, has cut out fast food. Coronary CTA revealed calcium score of zero. Echo  revealed LVEF 60-65%, normal diastolic parameters, mild LVH, no wma, mild valvular disease.  ? ?Today, he is here alone for follow-up. Reports overall palpitations are better but had an episode yesterday while grilling, feeling a "pause" then heart beating fast. HR felt normal after rest. Reports he had been pretty active prior to this occurrence.  Is discouraged by weight gain recently, has been fasting until 3 pm and then eating a reasonable dinner. Has not returned to the gym. He denies chest pain, shortness of breath, lower extremity edema, fatigue, melena, hematuria, hemoptysis, diaphoresis, weakness, presyncope, syncope, orthopnea, and PND. Test results reviewed with patient in detail.  ? ?Past Medical History:  ?Diagnosis Date  ? Anxiety   ? Chest pain   ? GERD (gastroesophageal reflux disease)   ? Palpitations   ? Panic attack   ? ? ?Past Surgical History:  ?Procedure Laterality Date  ? KNEE SURGERY    ? ? ?Current Medications: ?Current Meds  ?Medication Sig  ? ALPRAZolam (XANAX) 0.5 MG tablet Take 0.5 mg by mouth daily as needed.  ? busPIRone (BUSPAR) 10 MG tablet Take 10 mg by mouth 2 (two) times daily.  ? hydrOXYzine (ATARAX) 25 MG tablet Take 12.5 mg by mouth 2 (two) times daily as needed.  ? metoprolol succinate (TOPROL XL) 25 MG 24 hr tablet Take 1 tablet (25 mg total) by mouth at bedtime.  ? pantoprazole (PROTONIX) 20 MG tablet Take 20 mg by mouth daily.  ?  ? ?Allergies:  Nitroglycerin, Iodinated contrast media, Iodine, and Mushroom extract complex  ? ?Social History  ? ?Socioeconomic History  ? Marital status: Married  ?  Spouse name: Not on file  ? Number of children: Not on file  ? Years of education: Not on file  ? Highest education level: Not on file  ?Occupational History  ? Not on file  ?Tobacco Use  ? Smoking status: Never  ? Smokeless tobacco: Never  ?Vaping Use  ? Vaping Use: Never used  ?Substance and Sexual Activity  ? Alcohol use: Yes  ?  Comment: occ  ? Drug use: Never  ? Sexual  activity: Not on file  ?Other Topics Concern  ? Not on file  ?Social History Narrative  ? Not on file  ? ?Social Determinants of Health  ? ?Financial Resource Strain: Not on file  ?Food Insecurity: Not on file  ?Transportation Needs: Not on file  ?Physical Activity: Not on file  ?Stress: Not on file  ?Social Connections: Not on file  ?  ? ?Family History: ?The patient's family history includes Atrial fibrillation in his mother; Healthy in his father; Hypertension in his brother. ? ?ROS:   ?Please see the history of present illness.    ?+ palpitations ?All other systems reviewed and are negative. ? ?Labs/Other Studies Reviewed:   ? ?The following studies were reviewed today: ? ?Echo 10/15/21 ? ?1. Left ventricular ejection fraction, by estimation, is 60 to 65%. The  ?left ventricle has normal function. The left ventricle has no regional  ?wall motion abnormalities. There is mild concentric left ventricular  ?hypertrophy. Left ventricular diastolic  ?parameters were normal.  ? 2. Right ventricular systolic function is normal. The right ventricular  ?size is normal.  ? 3. The mitral valve is normal in structure. Trivial mitral valve  ?regurgitation. No evidence of mitral stenosis.  ? 4. Tricuspid valve regurgitation is mild to moderate.  ? 5. The aortic valve is normal in structure. Aortic valve regurgitation is  ?not visualized. No aortic stenosis is present.  ? 6. The inferior vena cava is normal in size with greater than 50%  ?respiratory variability, suggesting right atrial pressure of 3 mmHg.  ? ?Comparison(s): No prior Echocardiogram.  ? ?Coronary CTA 10/19/21 ? ?IMPRESSION: ?1. Coronary calcium score of 0. This was 0 percentile for age and ?sex matched control. ?  ?2. Normal coronary origin with right dominance. ?  ?3. No evidence of CAD. CAD-RADS 0. No evidence of CAD (0%). Consider ?non-atherosclerotic causes of chest pain. ? ?Cardiac monitor 07/2020 ? ?The basic underlying rhythm is normal sinus rhythm. Sinus  bradycardia occurs during sleep. ?Isolated PVCs less than 1% burden ?Isolated PACs less than 1% burden. ?4 beat salvo of PVCs. Asymptomatic ?No sustained supraventricular arrhythmias or salvos. ?Multiple complaints of palpitations/flutter occur without association to rhythm. There is a low level correlation with PVCs. ?  ?Overall, very high symptom documentation but poor correlation with arrhythmia. ?Overall benign study. ? ? ?Recent Labs: ?10/09/2021: TSH 0.753  ?Recent Lipid Panel ?From PCP 04/2021 ?HDL 42 ?Trigs 125 ?LDL 95 ? ? ?Risk Assessment/Calculations:   ?  ? ? ?Physical Exam:   ? ?VS:  BP 132/82   Pulse 60   Ht 6\' 5"  (1.956 m)   Wt (!) 443 lb 12.8 oz (201.3 kg)   SpO2 98%   BMI 52.63 kg/m?    ? ?Wt Readings from Last 3 Encounters:  ?12/10/21 (!) 443 lb 12.8 oz (201.3 kg)  ?10/09/21 (!) 438 lb (198.7  kg)  ?04/19/21 (!) 420 lb (190.5 kg)  ?  ? ?GEN:  Obese, well appearing in no acute distress ?HEENT: Normal ?NECK: No JVD; No carotid bruits ?CARDIAC: RRR, no murmurs, rubs, gallops ?RESPIRATORY:  Clear to auscultation without rales, wheezing or rhonchi  ?ABDOMEN: Soft, non-tender, non-distended ?MUSCULOSKELETAL:  No edema; No deformity. 2+ pedal pulses, equal bilaterally ?SKIN: Warm and dry ?NEUROLOGIC:  Alert and oriented x 3 ?PSYCHIATRIC:  Normal affect  ? ?EKG:  EKG is not today.   ? ?Diagnoses:   ? ?1. Obesity, morbid, BMI 50 or higher (HCC)   ?2. Left ventricular hypertrophy   ?3. Palpitations   ? ? ?Assessment and Plan:   ? ? ?Palpitations: Symptoms have improved overall on daily metoprolol. Had an exacerbation yesterday after working outside to get grill ready for cooking. Felt better with a few minutes of rest. No indication that we need to increase the dose at this time. Continue Toprol XL 25 mg at bedtime.  ? ?Chest pain: No further episodes of chest pain since starting metoprolol. He wants to ensure it is safe for him to work out. Reviewed coronary CT results in detail. Encouraged CV risk  reduction with heart healthy, mostly plant based diet, weight loss, 150 minutes moderate exercise each week, LDL goal < 100, and good BP control.  ? ?LVH:  Mild concentric LVH bye echo 10/15/21. As noted above, encouraged w

## 2021-12-10 ENCOUNTER — Encounter: Payer: Self-pay | Admitting: Nurse Practitioner

## 2021-12-10 ENCOUNTER — Ambulatory Visit (INDEPENDENT_AMBULATORY_CARE_PROVIDER_SITE_OTHER): Payer: 59 | Admitting: Nurse Practitioner

## 2021-12-10 DIAGNOSIS — I517 Cardiomegaly: Secondary | ICD-10-CM

## 2021-12-10 DIAGNOSIS — R002 Palpitations: Secondary | ICD-10-CM | POA: Diagnosis not present

## 2021-12-10 NOTE — Patient Instructions (Signed)
Medication Instructions:  ? ?Your physician recommends that you continue on your current medications as directed. Please refer to the Current Medication list given to you today. ? ?*If you need a refill on your cardiac medications before your next appointment, please call your pharmacy* ? ? ?Lab Work: ? ?None ordered.  ? ?If you have labs (blood work) drawn today and your tests are completely normal, you will receive your results only by: ?MyChart Message (if you have MyChart) OR ?A paper copy in the mail ?If you have any lab test that is abnormal or we need to change your treatment, we will call you to review the results. ? ? ?Testing/Procedures: ? ?None ordered.  ? ?Follow-Up: ?At [facility], you and your health needs are our priority.  As part of our continuing mission to provide you with exceptional heart care, we have created designated Provider Care Teams.  These Care Teams include your primary Cardiologist (physician) and Advanced Practice Providers (APPs -  Physician Assistants and Nurse Practitioners) who all work together to provide you with the care you need, when you need it. ? ?We recommend signing up for the patient portal called "MyChart".  Sign up information is provided on this After Visit Summary.  MyChart is used to connect with patients for Virtual Visits (Telemedicine).  Patients are able to view lab/test results, encounter notes, upcoming appointments, etc.  Non-urgent messages can be sent to your provider as well.   ?To learn more about what you can do with MyChart, go to [email or web address].   ? ?Your next appointment:   ?6 month(s) ? ?The format for your next appointment:   ?In Person ? ?Provider:   ?Sinclair Grooms, MD   ? ? ?Other Instructions ? ?You have been referred to [facility]. They will call you to schedule an appointment. ? ? ?Important Information About Sugar ? ? ? ? ?  ?

## 2022-01-18 ENCOUNTER — Emergency Department (HOSPITAL_COMMUNITY)
Admission: EM | Admit: 2022-01-18 | Discharge: 2022-01-18 | Disposition: A | Discharge status: Home / Self Care | Payer: 59 | Attending: Emergency Medicine | Admitting: Emergency Medicine

## 2022-01-18 DIAGNOSIS — R002 Palpitations: Secondary | ICD-10-CM

## 2022-01-18 DIAGNOSIS — F419 Anxiety disorder, unspecified: Secondary | ICD-10-CM | POA: Diagnosis not present

## 2022-02-05 ENCOUNTER — Emergency Department (HOSPITAL_COMMUNITY)
Admission: EM | Admit: 2022-02-05 | Discharge: 2022-02-05 | Discharge status: Against Medical Advice | Payer: 59 | Attending: Emergency Medicine | Admitting: Emergency Medicine

## 2022-02-05 ENCOUNTER — Emergency Department (HOSPITAL_COMMUNITY): Payer: 59

## 2022-02-05 ENCOUNTER — Encounter (HOSPITAL_COMMUNITY): Payer: Self-pay

## 2022-02-05 ENCOUNTER — Other Ambulatory Visit: Payer: Self-pay

## 2022-02-05 DIAGNOSIS — Z5321 Procedure and treatment not carried out due to patient leaving prior to being seen by health care provider: Secondary | ICD-10-CM | POA: Diagnosis not present

## 2022-02-05 DIAGNOSIS — R072 Precordial pain: Secondary | ICD-10-CM | POA: Insufficient documentation

## 2022-02-05 DIAGNOSIS — Z7982 Long term (current) use of aspirin: Secondary | ICD-10-CM | POA: Insufficient documentation

## 2022-02-05 DIAGNOSIS — R1013 Epigastric pain: Secondary | ICD-10-CM | POA: Insufficient documentation

## 2022-02-05 DIAGNOSIS — R0789 Other chest pain: Secondary | ICD-10-CM | POA: Diagnosis not present

## 2022-02-05 LAB — BASIC METABOLIC PANEL
Anion gap: 8 (ref 5–15)
BUN: 11 mg/dL (ref 6–20)
CO2: 25 mmol/L (ref 22–32)
Calcium: 9.1 mg/dL (ref 8.9–10.3)
Chloride: 109 mmol/L (ref 98–111)
Creatinine, Ser: 0.98 mg/dL (ref 0.61–1.24)
GFR, Estimated: >60 mL/min (ref >60)
Glucose, Bld: 82 mg/dL (ref 70–99)
Potassium: 3.8 mmol/L (ref 3.5–5.1)
Sodium: 142 mmol/L (ref 135–145)

## 2022-02-05 LAB — CBC WITH DIFFERENTIAL/PLATELET
Abs Immature Granulocytes: 0.03 10*3/uL (ref 0.00–0.07)
Basophils Absolute: 0 10*3/uL (ref 0.0–0.1)
Basophils Relative: 0 %
Eosinophils Absolute: 0.1 10*3/uL (ref 0.0–0.5)
Eosinophils Relative: 1 %
HCT: 45.5 % (ref 39.0–52.0)
Hemoglobin: 14.3 g/dL (ref 13.0–17.0)
Immature Granulocytes: 0 %
Lymphocytes Relative: 23 %
Lymphs Abs: 1.8 10*3/uL (ref 0.7–4.0)
MCH: 26.7 pg (ref 26.0–34.0)
MCHC: 31.4 g/dL (ref 30.0–36.0)
MCV: 84.9 fL (ref 80.0–100.0)
Monocytes Absolute: 0.8 10*3/uL (ref 0.1–1.0)
Monocytes Relative: 10 %
Neutro Abs: 5.3 10*3/uL (ref 1.7–7.7)
Neutrophils Relative %: 66 %
Platelets: 181 10*3/uL (ref 150–400)
RBC: 5.36 MIL/uL (ref 4.22–5.81)
RDW: 13.4 % (ref 11.5–15.5)
WBC: 7.9 10*3/uL (ref 4.0–10.5)
nRBC: 0 % (ref 0.0–0.2)

## 2022-02-05 LAB — TROPONIN I (HIGH SENSITIVITY): Troponin I (High Sensitivity): 6 ng/L (ref <18)

## 2022-02-05 NOTE — ED Provider Triage Note (Signed)
Emergency Medicine Provider Triage Evaluation Note  Jesse Mckinney , a 36 y.o. male with history of GERD, anxiety was evaluated in triage.  Pt complains of midsternal chest pain that started while he was at work approximately 2 hours prior to arrival.  Patient states pain is sharp, just above the epigastrium and has been coming and going.  He took 4 baby aspirin and 0.4 nitro at work with out significant relief of symptoms.  He does have history of acid reflux and states that his symptoms have been exacerbated recently, however this pain felt slightly different.  Denies diaphoresis, abdominal pain, nausea, vomiting, diarrhea and shortness of breath.  He is a non-smoker and nondrinker.  No recent travel, surgeries or trauma. Review of Systems  Positive: Chest pain Negative: As above  Physical Exam  BP 136/83 (BP Location: Right Arm)   Pulse 64   Temp 98.6 F (37 C) (Oral)   Resp 20   Ht 6\' 5"  (1.956 m)   Wt (!) 195 kg   SpO2 100%   BMI 50.99 kg/m  Gen:   Awake, no distress   Resp:  Normal effort  MSK:   Moves extremities without difficulty  Other:    Medical Decision Making  Medically screening exam initiated at 1:11 PM.  Appropriate orders placed.  Trevel Dillenbeck was informed that the remainder of the evaluation will be completed by another provider, this initial triage assessment does not replace that evaluation, and the importance of remaining in the ED until their evaluation is complete.     Herby Abraham, Janell Quiet 02/05/22 1313

## 2022-02-05 NOTE — ED Notes (Addendum)
Pt left facility, stated he had something to do and couldn't wait any longer, IV was removed before he left.

## 2022-02-05 NOTE — ED Triage Notes (Signed)
Pt BIB [facility] from work c/o [facility] and CP that started while he was sitting in a meeting this morning. Pt states it is centralized and does not radiate. Pt given 324 ASA and 0.4 Nitro.

## 2022-02-12 DIAGNOSIS — R002 Palpitations: Secondary | ICD-10-CM | POA: Diagnosis not present

## 2022-02-12 DIAGNOSIS — R69 Illness, unspecified: Secondary | ICD-10-CM | POA: Diagnosis not present

## 2022-03-04 DIAGNOSIS — S90212A Contusion of left great toe with damage to nail, initial encounter: Secondary | ICD-10-CM | POA: Diagnosis not present

## 2022-03-11 ENCOUNTER — Telehealth: Payer: Self-pay

## 2022-03-11 NOTE — Telephone Encounter (Signed)
Spoke with [facility], Aram Beecham, she can see the referral and is not sure why he has not be contacted yet.  She is going to ask the referral staff to review and call the patient.

## 2022-03-11 NOTE — Telephone Encounter (Signed)
**Note De-identified Qasim Diveley Obfuscation** -----  **Note De-Identified Wyndell Cardiff Obfuscation** Message from Levi Aland, NP sent at 03/11/2022  9:55 AM EDT ----- Good morning,   I referred this patient to medical weight loss in May. He has still not heard from them. Can you please check on the referral and let me know if there is an issue on our part or if they are that backed up with making appointments?   Thank you,  Marcelino Duster

## 2022-03-11 NOTE — Telephone Encounter (Signed)
**Note De-identified Alvis Pulcini Obfuscation** -----  **Note De-identified Deyra Perdomo Obfuscation** Message from Michelle M Swinyer, NP sent at 03/11/2022  9:55 AM EDT ----- Good morning,   I referred this patient to medical weight loss in May. He has still not heard from them. Can you please check on the referral and let me know if there is an issue on our part or if they are that backed up with making appointments?   Thank you,  Michelle     

## 2022-04-23 DIAGNOSIS — R457 State of emotional shock and stress, unspecified: Secondary | ICD-10-CM | POA: Diagnosis not present

## 2022-04-23 DIAGNOSIS — I959 Hypotension, unspecified: Secondary | ICD-10-CM | POA: Diagnosis not present

## 2022-04-23 DIAGNOSIS — R0789 Other chest pain: Secondary | ICD-10-CM | POA: Diagnosis not present

## 2022-04-23 DIAGNOSIS — R079 Chest pain, unspecified: Secondary | ICD-10-CM | POA: Diagnosis not present

## 2022-04-30 DIAGNOSIS — K047 Periapical abscess without sinus: Secondary | ICD-10-CM | POA: Diagnosis not present

## 2022-05-21 DIAGNOSIS — Z6841 Body Mass Index (BMI) 40.0 and over, adult: Secondary | ICD-10-CM | POA: Diagnosis not present

## 2022-05-21 DIAGNOSIS — Z131 Encounter for screening for diabetes mellitus: Secondary | ICD-10-CM | POA: Diagnosis not present

## 2022-05-21 DIAGNOSIS — K219 Gastro-esophageal reflux disease without esophagitis: Secondary | ICD-10-CM | POA: Diagnosis not present

## 2022-05-21 DIAGNOSIS — G4730 Sleep apnea, unspecified: Secondary | ICD-10-CM | POA: Diagnosis not present

## 2022-05-21 DIAGNOSIS — R69 Illness, unspecified: Secondary | ICD-10-CM | POA: Diagnosis not present

## 2022-05-21 DIAGNOSIS — E559 Vitamin D deficiency, unspecified: Secondary | ICD-10-CM | POA: Diagnosis not present

## 2022-05-21 DIAGNOSIS — Z13220 Encounter for screening for lipoid disorders: Secondary | ICD-10-CM | POA: Diagnosis not present

## 2022-05-21 DIAGNOSIS — Z0000 Encounter for general adult medical examination without abnormal findings: Secondary | ICD-10-CM | POA: Diagnosis not present

## 2022-05-31 DIAGNOSIS — G4719 Other hypersomnia: Secondary | ICD-10-CM | POA: Diagnosis not present

## 2022-05-31 DIAGNOSIS — Z6841 Body Mass Index (BMI) 40.0 and over, adult: Secondary | ICD-10-CM | POA: Diagnosis not present

## 2022-05-31 DIAGNOSIS — R69 Illness, unspecified: Secondary | ICD-10-CM | POA: Diagnosis not present

## 2022-06-06 ENCOUNTER — Emergency Department (HOSPITAL_COMMUNITY)
Admission: EM | Admit: 2022-06-06 | Discharge: 2022-06-06 | Disposition: A | Discharge status: Home / Self Care | Payer: 59 | Attending: Emergency Medicine | Admitting: Emergency Medicine

## 2022-06-06 ENCOUNTER — Encounter (HOSPITAL_COMMUNITY): Payer: Self-pay | Admitting: Emergency Medicine

## 2022-06-06 ENCOUNTER — Emergency Department (HOSPITAL_COMMUNITY): Payer: 59

## 2022-06-06 ENCOUNTER — Other Ambulatory Visit: Payer: Self-pay

## 2022-06-06 DIAGNOSIS — R0602 Shortness of breath: Secondary | ICD-10-CM | POA: Diagnosis not present

## 2022-06-06 DIAGNOSIS — R0789 Other chest pain: Secondary | ICD-10-CM | POA: Diagnosis not present

## 2022-06-06 DIAGNOSIS — J069 Acute upper respiratory infection, unspecified: Secondary | ICD-10-CM | POA: Insufficient documentation

## 2022-06-06 DIAGNOSIS — B9789 Other viral agents as the cause of diseases classified elsewhere: Secondary | ICD-10-CM | POA: Diagnosis not present

## 2022-06-06 DIAGNOSIS — R059 Cough, unspecified: Secondary | ICD-10-CM | POA: Diagnosis present

## 2022-06-06 DIAGNOSIS — R079 Chest pain, unspecified: Secondary | ICD-10-CM | POA: Diagnosis not present

## 2022-06-06 MED ORDER — BENZONATATE 100 MG PO CAPS: 100.0000 mg | ORAL_CAPSULE | Freq: Three times a day (TID) | ORAL | 0 refills | Status: DC | PRN

## 2022-06-06 MED ORDER — ALBUTEROL SULFATE HFA 108 (90 BASE) MCG/ACT IN AERS: 2.0000 | INHALATION_SPRAY | RESPIRATORY_TRACT | Status: DC | PRN

## 2022-06-06 NOTE — Discharge Instructions (Addendum)
We evaluated you for your cough your examination was reassuring.  We did not see any signs of any dangerous process such as a pneumonia, collapsed lung.  Your EKG did not show signs of a heart attack.  Please do not take the amoxicillin for your symptoms.  This will not help. Please take Tylenol and Motrin for your symptoms at home.  You can take 650 mg of Tylenol every 6 hours and 600 mg of ibuprofen every 6 hours as needed for your symptoms.  You can take these medicines together as needed, either at the same time, or alternating every 3 hours. We have also prescribed Tessalon for your symptoms, which you can take every 8 hours as needed for cough.   Please return to the emergency department if you develop any fainting, severe pain, difficulty breathing, or any other concerning symptoms.

## 2022-06-06 NOTE — ED Triage Notes (Signed)
BIBA Per EMS: Pt coming from home w/ c/o nasal congestion x 5 days. Pt lso reports SHOB & chest pain due to cough. Hx anxiety.  VSS

## 2022-06-06 NOTE — ED Provider Notes (Signed)
[facility]-EMERGENCY DEPT Provider Note  CSN: [id number] Arrival date & time: 06/06/22 1016  Chief Complaint(s) Nasal Congestion and Shortness of Breath  HPI Jesse Mckinney is a 36 y.o. male with history of anxiety presenting to the emergency department via EMS for cough.  Patient reports that he was having cough and felt short of breath so he called 911.  He reports associated nasal congestion and runny nose, sore throat, diarrhea.  Symptoms have been present for 5 days.  He has been taking Mucinex without significant relief as well as some amoxicillin he had lying around his house.  He denies productive cough.  Symptoms are now mild, he reports that they were significant earlier.  He reports some chest pain with coughing but otherwise denies pleuritic or exertional chest pain.   Past Medical History Past Medical History:  Diagnosis Date   Anxiety    Chest pain    GERD (gastroesophageal reflux disease)    Palpitations    Panic attack    There are no problems to display for this patient.  Home Medication(s) Prior to Admission medications   Medication Sig Start Date End Date Taking? Authorizing Provider  benzonatate (TESSALON) 100 MG capsule Take 1 capsule (100 mg total) by mouth 3 (three) times daily as needed for cough. 06/06/22  Yes Lonell Grandchild, MD  ALPRAZolam Prudy Feeler) 0.5 MG tablet Take 0.5 mg by mouth daily as needed. Patient not taking: Reported on 01/18/2022 12/08/21   [provider]  amoxicillin (AMOXIL) 500 MG capsule Take 500 mg by mouth 3 (three) times daily. Patient not taking: Reported on 01/18/2022 01/08/22   [provider]  busPIRone (BUSPAR) 10 MG tablet Take 10 mg by mouth daily. 10/01/21   [provider]  CLOMID 50 MG tablet Take 25 mg by mouth daily. 12/08/21   [provider]  Fiber Adult Gummies 2 g CHEW Chew 1 Dose by mouth daily.    [provider]  metoprolol succinate (TOPROL XL) 25 MG 24 hr  tablet Take 1 tablet (25 mg total) by mouth at bedtime. 10/09/21 10/10/22  Swinyer, Zachary George, NP  Multiple Vitamin (ONE-A-DAY MENS PO) Take 1 Dose by mouth daily. One-A-Day Men's gummy vitamin    [provider]  pantoprazole (PROTONIX) 20 MG tablet Take 20 mg by mouth daily as needed for heartburn. 09/27/21   [provider]                                                                                                                                    Past Surgical History Past Surgical History:  Procedure Laterality Date   KNEE SURGERY     Family History Family History  Problem Relation Age of Onset   Atrial fibrillation Mother    Healthy Father    Hypertension Brother     Social History Social History   Tobacco Use   Smoking status:  Never   Smokeless tobacco: Never  Vaping Use   Vaping Use: Never used  Substance Use Topics   Alcohol use: Yes    Comment: occ   Drug use: Never   Allergies Nitrostat [nitroglycerin], Iodinated contrast media, Iodine, and Mushroom extract complex  Review of Systems Review of Systems  All other systems reviewed and are negative.   Physical Exam Vital Signs  I have reviewed the triage vital signs BP (!) 178/103 (BP Location: Right Arm)   Pulse 84   Temp 98.3 F (36.8 C) (Oral)   Resp (!) 24   SpO2 98%  Physical Exam Vitals and nursing note reviewed.  Constitutional:      General: He is not in acute distress.    Appearance: Normal appearance. He is obese.  HENT:     Nose: Congestion and rhinorrhea present.     Mouth/Throat:     Mouth: Mucous membranes are moist.  Eyes:     Conjunctiva/sclera: Conjunctivae normal.  Cardiovascular:     Rate and Rhythm: Normal rate and regular rhythm.  Pulmonary:     Effort: Pulmonary effort is normal. No respiratory distress.     Breath sounds: Normal breath sounds.  Abdominal:     General: Abdomen is flat.     Palpations: Abdomen is soft.     Tenderness: There is no  abdominal tenderness.  Musculoskeletal:     Right lower leg: No edema.     Left lower leg: No edema.  Skin:    General: Skin is warm and dry.     Capillary Refill: Capillary refill takes less than 2 seconds.  Neurological:     Mental Status: He is alert and oriented to person, place, and time. Mental status is at baseline.  Psychiatric:        Mood and Affect: Mood normal.        Behavior: Behavior normal.     ED Results and Treatments Labs (all labs ordered are listed, but only abnormal results are displayed) Labs Reviewed - No data to display                                                                                                                        Radiology DG Chest 2 View  Result Date: 06/06/2022 CLINICAL DATA:  Nasal congestion and shortness of breath. Chest pain. History of anxiety. EXAM: CHEST - 2 VIEW COMPARISON:  02/05/2022 FINDINGS: Midline trachea. Normal heart size and mediastinal contours. No pleural effusion or pneumothorax. Clear lungs. IMPRESSION: No active cardiopulmonary disease. Electronically Signed   By: Jeronimo Greaves M.D.   On: 06/06/2022 10:50    Pertinent labs & imaging results that were available during my care of the patient were reviewed by me and considered in my medical decision making (see MDM for details).  Medications Ordered in ED Medications  albuterol (VENTOLIN HFA) 108 (90 Base) MCG/ACT inhaler 2 puff (has no administration in time range)  Procedures Procedures  (including critical care time)  Medical Decision Making / ED Course   MDM:  36 year old male presenting for cough.  Patient overall well-appearing, physical exam with clear lungs.  Does appear congested with runny nose.  Symptoms most likely due to upper respiratory tract infection.  Chest x-ray is clear.  His lungs are clear with no  wheezing.  Albuterol inhaler was ordered in triage by nursing.  Differential also includes ACS, dissection, pulmonary embolism, pneumothorax, esophageal pathology.  Doubt ACS with reassuring EKG, atypical symptoms with associated viral URI symptoms.  Doubt dissection given mild symptoms, no chest pain at rest only with coughing.  Doubt PE, no PE risk factors, PERC negative.  Doubt pneumothorax given reassuring chest x-ray.  No nausea or vomiting to suggest esophageal pathology such as esophageal rupture.  Given reassuring work-up and physical exam, will discharge patient to home. All questions answered. Patient comfortable with plan of discharge. Return precautions discussed with patient and specified on the after visit summary.       Additional history obtained: -Additional history obtained from ems -External records from outside source obtained and reviewed including: Chart review including previous notes, labs, imaging, consultation notes including ED visit 01/18/22  EKG   EKG Interpretation  Date/Time:  Thursday June 06 2022 10:23:12 EST Ventricular Rate:  81 PR Interval:  159 QRS Duration: 87 QT Interval:  359 QTC Calculation: 417 R Axis:   38 Text Interpretation: Sinus rhythm Inferior infarct, age indeterminate Confirmed by Alvino Blood ([id number]) on 06/06/2022 10:39:08 AM         Imaging Studies ordered: I ordered imaging studies including CXR On my interpretation imaging demonstrates no acute process I independently visualized and interpreted imaging. I agree with the radiologist interpretation   Medicines ordered and prescription drug management: Meds ordered this encounter  Medications   albuterol (VENTOLIN HFA) 108 (90 Base) MCG/ACT inhaler 2 puff   benzonatate (TESSALON) 100 MG capsule    Sig: Take 1 capsule (100 mg total) by mouth 3 (three) times daily as needed for cough.    Dispense:  21 capsule    Refill:  0    -I have reviewed the patients home  medicines and have made adjustments as needed    Social Determinants of Health:  Diagnosis or treatment significantly limited by social determinants of health: obesity   Reevaluation: After the interventions noted above, I reevaluated the patient and found that they have improved  Co morbidities that complicate the patient evaluation  Past Medical History:  Diagnosis Date   Anxiety    Chest pain    GERD (gastroesophageal reflux disease)    Palpitations    Panic attack       Dispostion: Disposition decision including need for hospitalization was considered, and patient discharged from emergency department.    Final Clinical Impression(s) / ED Diagnoses Final diagnoses:  Viral upper respiratory tract infection     This chart was dictated using voice recognition software.  Despite best efforts to proofread,  errors can occur which can change the documentation meaning.    Lonell Grandchild, MD 06/06/22 1135

## 2022-06-06 NOTE — ED Notes (Signed)
Called patient from the lobby to discharge and patient was no longer here.

## 2022-06-07 DIAGNOSIS — J189 Pneumonia, unspecified organism: Secondary | ICD-10-CM | POA: Diagnosis not present

## 2022-06-07 DIAGNOSIS — J018 Other acute sinusitis: Secondary | ICD-10-CM | POA: Diagnosis not present

## 2022-06-09 ENCOUNTER — Encounter (HOSPITAL_BASED_OUTPATIENT_CLINIC_OR_DEPARTMENT_OTHER): Payer: Self-pay | Admitting: Pediatrics

## 2022-06-09 ENCOUNTER — Emergency Department (HOSPITAL_BASED_OUTPATIENT_CLINIC_OR_DEPARTMENT_OTHER): Payer: 59

## 2022-06-09 ENCOUNTER — Emergency Department (HOSPITAL_BASED_OUTPATIENT_CLINIC_OR_DEPARTMENT_OTHER)
Admission: EM | Admit: 2022-06-09 | Discharge: 2022-06-09 | Disposition: A | Discharge status: Home / Self Care | Payer: 59 | Attending: Emergency Medicine | Admitting: Emergency Medicine

## 2022-06-09 ENCOUNTER — Other Ambulatory Visit: Payer: Self-pay

## 2022-06-09 DIAGNOSIS — R059 Cough, unspecified: Secondary | ICD-10-CM | POA: Diagnosis present

## 2022-06-09 DIAGNOSIS — J069 Acute upper respiratory infection, unspecified: Secondary | ICD-10-CM | POA: Diagnosis not present

## 2022-06-09 DIAGNOSIS — Z1152 Encounter for screening for COVID-19: Secondary | ICD-10-CM | POA: Insufficient documentation

## 2022-06-09 DIAGNOSIS — B9789 Other viral agents as the cause of diseases classified elsewhere: Secondary | ICD-10-CM | POA: Diagnosis not present

## 2022-06-09 LAB — RESP PANEL BY RT-PCR (FLU A&B, COVID) ARPGX2
Influenza A by PCR: NEGATIVE
Influenza B by PCR: NEGATIVE
SARS Coronavirus 2 by RT PCR: NEGATIVE

## 2022-06-09 MED ORDER — ALBUTEROL SULFATE HFA 108 (90 BASE) MCG/ACT IN AERS
2.0000 | INHALATION_SPRAY | Freq: Once | RESPIRATORY_TRACT | Status: AC
  Administered 2022-06-09: 2 via RESPIRATORY_TRACT
  Filled 2022-06-09: qty 6.7

## 2022-06-09 MED ORDER — PREDNISONE 10 MG PO TABS: 20.0000 mg | ORAL_TABLET | Freq: Every day | ORAL | 0 refills | Status: AC

## 2022-06-09 MED ORDER — PREDNISONE 50 MG PO TABS
60.0000 mg | ORAL_TABLET | Freq: Once | ORAL | Status: AC
Start: 2022-06-09 — End: 2022-06-09
  Administered 2022-06-09: 60 mg via ORAL
  Filled 2022-06-09: qty 1

## 2022-06-09 NOTE — ED Triage Notes (Signed)
C/O cough; stated on ABX as well.

## 2022-06-09 NOTE — ED Provider Notes (Signed)
[facility] EMERGENCY DEPARTMENT Provider Note   CSN: [id number] Arrival date & time: 06/09/22  1015     History  Chief Complaint  Patient presents with   Cough    Jesse Mckinney is a 36 y.o. male.  Patient here with ongoing cough.  He has been on [employer], doxycycline.  Still continues with cough.  Seen here 3 days ago for the same.  Denies any fever, chills.  No shortness of breath or chest pain.  Nothing makes it worse or better.  No significant medical history.  No asthma history of smoking history.  The history is provided by the patient.       Home Medications Prior to Admission medications   Medication Sig Start Date End Date Taking? Authorizing Provider  predniSONE (DELTASONE) 10 MG tablet Take 2 tablets (20 mg total) by mouth daily for 5 days. 06/09/22 06/14/22 Yes Jesse Mckinney  ALPRAZolam Prudy Feeler) 0.5 MG tablet Take 0.5 mg by mouth daily as needed. Patient not taking: Reported on 01/18/2022 12/08/21   [provider]  amoxicillin (AMOXIL) 500 MG capsule Take 500 mg by mouth 3 (three) times daily. Patient not taking: Reported on 01/18/2022 01/08/22   [provider]  benzonatate (TESSALON) 100 MG capsule Take 1 capsule (100 mg total) by mouth 3 (three) times daily as needed for cough. 06/06/22   Jesse Grandchild, MD  busPIRone (BUSPAR) 10 MG tablet Take 10 mg by mouth daily. 10/01/21   [provider]  CLOMID 50 MG tablet Take 25 mg by mouth daily. 12/08/21   [provider]  Fiber Adult Gummies 2 g CHEW Chew 1 Dose by mouth daily.    [provider]  metoprolol succinate (TOPROL XL) 25 MG 24 hr tablet Take 1 tablet (25 mg total) by mouth at bedtime. 10/09/21 10/10/22  Jesse Mckinney  Multiple Vitamin (ONE-A-DAY MENS PO) Take 1 Dose by mouth daily. One-A-Day Men's gummy vitamin    [provider]  pantoprazole (PROTONIX) 20 MG tablet Take 20 mg by mouth daily as needed for heartburn. 09/27/21    [provider]      Allergies    Nitrostat [nitroglycerin], Iodinated contrast media, Iodine, and Mushroom extract complex    Review of Systems   Review of Systems  Physical Exam Updated Vital Signs BP 131/88 (BP Location: Left Arm)   Pulse 78   Temp 99.4 F (37.4 C) (Oral)   Resp 18   Ht 6\' 5"  (1.956 m)   Wt (!) 208.7 kg   SpO2 97%   BMI 54.55 kg/m  Physical Exam Vitals and nursing note reviewed.  Constitutional:      General: He is not in acute distress.    Appearance: He is well-developed.  HENT:     Head: Normocephalic and atraumatic.     Mouth/Throat:     Mouth: Mucous membranes are moist.  Eyes:     Extraocular Movements: Extraocular movements intact.     Conjunctiva/sclera: Conjunctivae normal.     Pupils: Pupils are equal, round, and reactive to light.  Cardiovascular:     Rate and Rhythm: Normal rate and regular rhythm.     Pulses: Normal pulses.     Heart sounds: Normal heart sounds. No murmur heard. Pulmonary:     Effort: Pulmonary effort is normal. No respiratory distress.     Breath sounds: Normal breath sounds.  Abdominal:     Palpations: Abdomen is soft.     Tenderness:  There is no abdominal tenderness.  Musculoskeletal:        General: No swelling.     Cervical back: Neck supple.  Skin:    General: Skin is warm and dry.     Capillary Refill: Capillary refill takes less than 2 seconds.  Neurological:     Mental Status: He is alert.  Psychiatric:        Mood and Affect: Mood normal.     ED Results / Procedures / Treatments   Labs (all labs ordered are listed, but only abnormal results are displayed) Labs Reviewed  RESP PANEL BY RT-PCR (FLU A&B, COVID) [id number]    EKG None  Radiology DG Chest Portable 1 View  Result Date: 06/09/2022 CLINICAL DATA:  36 year old male with cough. Started on antibiotics. EXAM: PORTABLE CHEST 1 VIEW COMPARISON:  Chest radiographs 06/06/2022 and earlier. FINDINGS: Portable AP upright view at 1031  hours. Lung volumes and mediastinal contours remain normal. Visualized tracheal air column is within normal limits. Allowing for portable technique the lungs are clear. No pneumothorax or pleural effusion. No osseous abnormality identified. IMPRESSION: Negative portable chest. Electronically Signed   By: Jesse Fleming M.D.   On: 06/09/2022 10:46    Procedures Procedures    Medications Ordered in ED Medications  predniSONE (DELTASONE) tablet 60 mg (has no administration in time range)  albuterol (VENTOLIN HFA) 108 (90 Base) MCG/ACT inhaler 2 puff (2 puffs Inhalation Given 06/09/22 1049)    ED Course/ Medical Decision Making/ A&P                           Medical Decision Making Amount and/or Complexity of Data Reviewed Radiology: ordered.  Risk Prescription drug management.   [name] is here with cough.  Normal vitals.  No fever.  No significant comorbidities.  Already on cough medicine with Tessalon Perles and antibiotics.  Chest x-ray today shows no pneumonia.  Overall he has clear breath sounds.  I suspect that he has a postviral cough.  We will have him finish his antibiotics.  We will give him albuterol inhaler for further symptomatic support.  Will prescribe steroids to help with cough.  Overall I have no concern for PE or ACS or other acute process.  He is very well-appearing with normal vitals.  Given reassurance and discharged in good condition.  Recommend follow-up with primary care doctor.  This chart was dictated using voice recognition software.  Despite best efforts to proofread,  errors can occur which can change the documentation meaning.         Final Clinical Impression(s) / ED Diagnoses Final diagnoses:  Viral URI with cough    Rx / DC Orders ED Discharge Orders          Ordered    predniSONE (DELTASONE) 10 MG tablet  Daily        06/09/22 1059              Jesse Mckinney 06/09/22 1101

## 2022-06-09 NOTE — Discharge Instructions (Signed)
Take next dose of prednisone tomorrow morning.  Use albuterol inhaler 2 puffs every 4 hours as needed.  Continue your antibiotics.

## 2022-06-16 NOTE — Progress Notes (Deleted)
Cardiology Office Note:    Date:  06/16/2022   ID:  Jesse Mckinney, DOB 1986/05/28, MRN [id number]  PCP:  Default, Provider, MD  Cardiologist:  Sinclair Grooms, MD   Referring MD: No ref. provider found   No chief complaint on file.   History of Present Illness:    Jesse Mckinney is a 35 y.o. male with a hx of h/o chest pain, DOE, and palpitations.  ***  Past Medical History:  Diagnosis Date   Anxiety    Chest pain    GERD (gastroesophageal reflux disease)    Palpitations    Panic attack     Past Surgical History:  Procedure Laterality Date   KNEE SURGERY      Current Medications: No outpatient medications have been marked as taking for the 06/18/22 encounter (Appointment) with Belva Crome, MD.     Allergies:   Nitrostat [nitroglycerin], Iodinated contrast media, Iodine, and Mushroom extract complex   Social History   Socioeconomic History   Marital status: Married    Spouse name: Not on file   Number of children: Not on file   Years of education: Not on file   Highest education level: Not on file  Occupational History   Not on file  Tobacco Use   Smoking status: Never   Smokeless tobacco: Never  Vaping Use   Vaping Use: Never used  Substance and Sexual Activity   Alcohol use: Yes    Comment: occ   Drug use: Never   Sexual activity: Not on file  Other Topics Concern   Not on file  Social History Narrative   Not on file   Social Determinants of Health   Financial Resource Strain: Not on file  Food Insecurity: Not on file  Transportation Needs: Not on file  Physical Activity: Not on file  Stress: Not on file  Social Connections: Not on file     Family History: The patient's family history includes Atrial fibrillation in his mother; Healthy in his father; Hypertension in his brother.  ROS:   Please see the history of present illness.    *** All other systems reviewed and are negative.  EKGs/Labs/Other Studies Reviewed:    The  following studies were reviewed today: CT Coronary Angio 09/2021: IMPRESSION: 1. Coronary calcium score of 0. This was 0 percentile for age and sex matched control.   2. Normal coronary origin with right dominance.   3. No evidence of CAD. CAD-RADS 0. No evidence of CAD (0%). Consider non-atherosclerotic causes of chest pain.  ECHOCARDIOGRAM 09/2021: IMPRESSIONS   1. Left ventricular ejection fraction, by estimation, is 60 to 65%. The  left ventricle has normal function. The left ventricle has no regional  wall motion abnormalities. There is mild concentric left ventricular  hypertrophy. Left ventricular diastolic  parameters were normal.   2. Right ventricular systolic function is normal. The right ventricular  size is normal.   3. The mitral valve is normal in structure. Trivial mitral valve  regurgitation. No evidence of mitral stenosis.   4. Tricuspid valve regurgitation is mild to moderate.   5. The aortic valve is normal in structure. Aortic valve regurgitation is  not visualized. No aortic stenosis is present.   6. The inferior vena cava is normal in size with greater than 50%  respiratory variability, suggesting right atrial pressure of 3 mmHg.   Comparison(s): No prior Echocardiogram.   Continuous Event Monitor 07/2020: Addendum by Belva Crome, MD on Wed Aug 30, 2020  4:55 PM   The basic underlying rhythm is normal sinus rhythm. Sinus bradycardia occurs during sleep. Isolated PVCs less than 1% burden Isolated PACs less than 1% burden. 4 beat salvo of PVCs. Asymptomatic No sustained supraventricular arrhythmias or salvos. Multiple complaints of palpitations/flutter occur without association to rhythm. There is a low level correlation with PVCs.   Overall, very high symptom documentation but poor correlation with arrhythmia.   Overall benign study.   EKG:  EKG ***  Recent Labs: 10/09/2021: TSH 0.753 02/05/2022: BUN 11; Creatinine, Ser 0.98; Hemoglobin 14.3;  Platelets 181; Potassium 3.8; Sodium 142  Recent Lipid Panel No results found for: "CHOL", "TRIG", "HDL", "CHOLHDL", "VLDL", "LDLCALC", "LDLDIRECT"  Physical Exam:    VS:  There were no vitals taken for this visit.    Wt Readings from Last 3 Encounters:  06/09/22 (!) 460 lb (208.7 kg)  02/05/22 (!) 430 lb (195 kg)  12/10/21 (!) 443 lb 12.8 oz (201.3 kg)     GEN: ***. No acute distress HEENT: Normal NECK: No JVD. LYMPHATICS: No lymphadenopathy CARDIAC: *** murmur. RRR *** gallop, or edema. VASCULAR: *** Normal Pulses. No bruits. RESPIRATORY:  Clear to auscultation without rales, wheezing or rhonchi  ABDOMEN: Soft, non-tender, non-distended, No pulsatile mass, MUSCULOSKELETAL: No deformity  SKIN: Warm and dry NEUROLOGIC:  Alert and oriented x 3 PSYCHIATRIC:  Normal affect   ASSESSMENT:    1. Left ventricular hypertrophy   2. Palpitations   3. Obesity, morbid, BMI 50 or higher (HCC)   4. Shortness of breath    PLAN:    In order of problems listed above:  ***   Medication Adjustments/Labs and Tests Ordered: Current medicines are reviewed at length with the patient today.  Concerns regarding medicines are outlined above.  No orders of the defined types were placed in this encounter.  No orders of the defined types were placed in this encounter.   There are no Patient Instructions on file for this visit.   Signed, Lesleigh Noe, MD  06/16/2022 11:08 AM    [facility]

## 2022-06-18 ENCOUNTER — Ambulatory Visit: Payer: 59 | Admitting: Interventional Cardiology

## 2022-06-18 DIAGNOSIS — R002 Palpitations: Secondary | ICD-10-CM

## 2022-06-18 DIAGNOSIS — R0602 Shortness of breath: Secondary | ICD-10-CM

## 2022-06-18 DIAGNOSIS — I517 Cardiomegaly: Secondary | ICD-10-CM

## 2022-06-25 ENCOUNTER — Encounter (HOSPITAL_BASED_OUTPATIENT_CLINIC_OR_DEPARTMENT_OTHER): Payer: Self-pay | Admitting: Emergency Medicine

## 2022-06-25 ENCOUNTER — Other Ambulatory Visit: Payer: Self-pay

## 2022-06-25 ENCOUNTER — Emergency Department (HOSPITAL_BASED_OUTPATIENT_CLINIC_OR_DEPARTMENT_OTHER)
Admission: EM | Admit: 2022-06-25 | Discharge: 2022-06-25 | Disposition: A | Discharge status: Home / Self Care | Payer: 59 | Attending: Emergency Medicine | Admitting: Emergency Medicine

## 2022-06-25 ENCOUNTER — Emergency Department (HOSPITAL_BASED_OUTPATIENT_CLINIC_OR_DEPARTMENT_OTHER): Payer: 59

## 2022-06-25 DIAGNOSIS — R062 Wheezing: Secondary | ICD-10-CM | POA: Diagnosis not present

## 2022-06-25 DIAGNOSIS — R0789 Other chest pain: Secondary | ICD-10-CM | POA: Diagnosis not present

## 2022-06-25 DIAGNOSIS — R079 Chest pain, unspecified: Secondary | ICD-10-CM | POA: Diagnosis not present

## 2022-06-25 DIAGNOSIS — R059 Cough, unspecified: Secondary | ICD-10-CM | POA: Diagnosis not present

## 2022-06-25 LAB — CBC
HCT: 46.3 % (ref 39.0–52.0)
Hemoglobin: 14.7 g/dL (ref 13.0–17.0)
MCH: 26.7 pg (ref 26.0–34.0)
MCHC: 31.7 g/dL (ref 30.0–36.0)
MCV: 84.2 fL (ref 80.0–100.0)
Platelets: 220 10*3/uL (ref 150–400)
RBC: 5.5 MIL/uL (ref 4.22–5.81)
RDW: 13.8 % (ref 11.5–15.5)
WBC: 9.2 10*3/uL (ref 4.0–10.5)
nRBC: 0 % (ref 0.0–0.2)

## 2022-06-25 LAB — BASIC METABOLIC PANEL
Anion gap: 8 (ref 5–15)
BUN: 19 mg/dL (ref 6–20)
CO2: 25 mmol/L (ref 22–32)
Calcium: 9.2 mg/dL (ref 8.9–10.3)
Chloride: 109 mmol/L (ref 98–111)
Creatinine, Ser: 0.86 mg/dL (ref 0.61–1.24)
GFR, Estimated: >60 mL/min (ref >60)
Glucose, Bld: 115 mg/dL — ABNORMAL HIGH (ref 70–99)
Potassium: 3.4 mmol/L — ABNORMAL LOW (ref 3.5–5.1)
Sodium: 142 mmol/L (ref 135–145)

## 2022-06-25 LAB — TROPONIN I (HIGH SENSITIVITY): Troponin I (High Sensitivity): 3 ng/L (ref <18)

## 2022-06-25 MED ORDER — IPRATROPIUM-ALBUTEROL 0.5-2.5 (3) MG/3ML IN SOLN
3.0000 mL | RESPIRATORY_TRACT | Status: DC
  Administered 2022-06-25: 3 mL via RESPIRATORY_TRACT
  Filled 2022-06-25: qty 3

## 2022-06-25 MED ORDER — KETOROLAC TROMETHAMINE 15 MG/ML IJ SOLN
15.0000 mg | Freq: Once | INTRAMUSCULAR | Status: AC
  Administered 2022-06-25: 15 mg via INTRAMUSCULAR
  Filled 2022-06-25: qty 1

## 2022-06-25 MED ORDER — ALBUTEROL SULFATE HFA 108 (90 BASE) MCG/ACT IN AERS: 1.0000 | INHALATION_SPRAY | Freq: Four times a day (QID) | RESPIRATORY_TRACT | 0 refills | Status: DC | PRN

## 2022-06-25 MED ORDER — METHYLPREDNISOLONE 4 MG PO TBPK: ORAL_TABLET | ORAL | 0 refills | Status: DC

## 2022-06-25 MED ORDER — LORATADINE 10 MG PO TABS: 10.0000 mg | ORAL_TABLET | Freq: Every day | ORAL | 0 refills | Status: DC

## 2022-06-25 MED ORDER — AEROCHAMBER PLUS FLO-VU MISC
1.0000 | Freq: Once | Status: AC
  Administered 2022-06-25: 1
  Filled 2022-06-25: qty 1

## 2022-06-25 MED ORDER — GUAIFENESIN 100 MG/5ML PO LIQD: 100.0000 mg | ORAL | 0 refills | Status: DC | PRN

## 2022-06-25 NOTE — ED Triage Notes (Signed)
Shob for a few weeks. Dx with bronchitis and treated. Chest tightness and headache that started today. States sx are not improving.

## 2022-06-25 NOTE — ED Triage Notes (Signed)
Per EMS:  pt diagnosed with bronchitis x 3 weeks ago.  Not feeling better.  Continues on prednisone.  VSS.  Lungs clear.  99% on RA, resp 20, CBG 119

## 2022-06-25 NOTE — Discharge Instructions (Addendum)
You were seen in the emergency department today for prolonged cough with chest tightness.  Your overall workup today was overall reassuring.  You likely have postviral cough syndrome.  This past benefits with time, conservative treatment, and close follow-up with primary care provider.  Treatment is directed at relieving symptoms.  There is no cure for acute viral infections; it is usually best to let them run their course.  Symptoms usually last around 1-2 weeks, though cough may occasionally linger for up to 4-6 weeks.     Treatments may include:  Increased fluid intake. Sports drinks offer valuable electrolytes, sugars, and fluids.  Breathing heated mist or steam (vaporizer or shower).  Eating chicken soup or other clear broths, and maintaining good nutrition.  Getting plenty of rest.  Using lozenges, tea with honey, or warm salt water for cough relief/sore throat relief (Cepkaol or Halls lozenges are available over-the-counter) Utilizing albuterol inhaler, 1 to 2 puffs, every 4-6 hours as needed.  Please keep in mind this may increase your heart rate and is a normal side effect. Take over-the-counter Benadryl, Zyrtec, or Claritin to decrease sinus secretions, with Mucinex to help break up remaining mucus Continue to alternate between Tylenol and ibuprofen for pain and fever control.  You may return to work 24 hours after your temperature has returned to normal.  Please follow up with your primary care doctor in 3-5 days for recheck of ongoing symptoms.  Return to emergency department for emergent changing or worsening of symptoms.

## 2022-06-25 NOTE — ED Provider Notes (Cosign Needed Addendum)
[facility] EMERGENCY DEPARTMENT Provider Note   CSN: [id number] Arrival date & time: 06/25/22  1541     History  Chief Complaint  Patient presents with   Chest Pain   Cough    Jesse Mckinney is a 36 y.o. male with Hx of anxiety presenting with chief complaint of consistent cough and chest tightness.  3 weeks ago began having URI symptoms of congestion, runny nose, sinus pressure, sore throat, cough, body aches, and mild chest tightness.  In the ED and was told he has an upper respiratory infection.  Patient then returned to the ED on 06/09/2022 for reevaluation.  Chest x-ray without evidence of pneumonia.  Was suspected to have a postviral cough.  Was recommended to finish his antibiotics and given an albuterol inhaler as needed as well as steroids.  Patient states he has used his albuterol 2-3 times since the, and did not take the steroids as he was concerned about mixing the 2.  Also stated the albuterol had caused his mouth to feel dry, was unsure if this was supposed to happen.  Reports that chest tightness not worse with exertion or lying down, this seems to worsen with cough.  Was already also taking amoxicillin at this time for a dental issue.  No Hx of asthma or lung disease.  The history is provided by the patient and medical records.  Chest Pain Associated symptoms: cough   Cough Associated symptoms: chest pain       Home Medications Prior to Admission medications   Medication Sig Start Date End Date Taking? Authorizing Provider  ALPRAZolam Prudy Feeler) 0.5 MG tablet Take 0.5 mg by mouth daily as needed. Patient not taking: Reported on 01/18/2022 12/08/21   [provider]  amoxicillin (AMOXIL) 500 MG capsule Take 500 mg by mouth 3 (three) times daily. Patient not taking: Reported on 01/18/2022 01/08/22   [provider]  benzonatate (TESSALON) 100 MG capsule Take 1 capsule (100 mg total) by mouth 3 (three) times daily as needed for cough. 06/06/22    Lonell Grandchild, MD  busPIRone (BUSPAR) 10 MG tablet Take 10 mg by mouth daily. 10/01/21   [provider]  CLOMID 50 MG tablet Take 25 mg by mouth daily. 12/08/21   [provider]  Fiber Adult Gummies 2 g CHEW Chew 1 Dose by mouth daily.    [provider]  metoprolol succinate (TOPROL XL) 25 MG 24 hr tablet Take 1 tablet (25 mg total) by mouth at bedtime. 10/09/21 10/10/22  Swinyer, Zachary George, NP  Multiple Vitamin (ONE-A-DAY MENS PO) Take 1 Dose by mouth daily. One-A-Day Men's gummy vitamin    [provider]  pantoprazole (PROTONIX) 20 MG tablet Take 20 mg by mouth daily as needed for heartburn. 09/27/21   [provider]      Allergies    Nitrostat [nitroglycerin], Iodinated contrast media, Iodine, and Mushroom extract complex    Review of Systems   Review of Systems  Respiratory:  Positive for cough.   Cardiovascular:  Positive for chest pain.    Physical Exam Updated Vital Signs BP 138/88 (BP Location: Left Arm)   Pulse 98   Temp 99.2 F (37.3 C)   Resp 20   Ht 6\' 5"  (1.956 m)   Wt (!) 208.7 kg   SpO2 97%   BMI 54.55 kg/m  Physical Exam Vitals and nursing note reviewed.  Constitutional:      General: He is not in acute distress.  Appearance: He is well-developed. He is not ill-appearing, toxic-appearing or diaphoretic.  HENT:     Head: Normocephalic and atraumatic.     Mouth/Throat:     Mouth: Mucous membranes are moist.     Tongue: Tongue does not deviate from midline.     Pharynx: Oropharynx is clear. Uvula midline. Posterior oropharyngeal erythema present.     Tonsils: No tonsillar exudate or tonsillar abscesses.  Eyes:     Conjunctiva/sclera: Conjunctivae normal.  Neck:     Comments: No meningismus or torticollis Cardiovascular:     Rate and Rhythm: Normal rate and regular rhythm.     Pulses:          Radial pulses are 2+ on the right side and 2+ on the left side.     Heart sounds: Normal heart sounds. No murmur  heard. Pulmonary:     Effort: Pulmonary effort is normal. No tachypnea, accessory muscle usage or respiratory distress.     Breath sounds: No stridor. Wheezing (Mild) present. No decreased breath sounds or rales.     Comments: Equal chest rise without diminished lung sounds.  Able to communicate well without difficulty.  Without increased respiratory effort. Chest:     Chest wall: Tenderness (Mild, chest wall, central) present. No mass, deformity or crepitus.  Abdominal:     Palpations: Abdomen is soft.     Tenderness: There is no abdominal tenderness.     Comments: Protuberant  Musculoskeletal:        General: No swelling.     Cervical back: Neck supple. No rigidity.     Right lower leg: No tenderness. No edema.     Left lower leg: No tenderness. No edema.  Lymphadenopathy:     Cervical: No cervical adenopathy.  Skin:    General: Skin is warm and dry.     Capillary Refill: Capillary refill takes less than 2 seconds.     Coloration: Skin is not cyanotic or pale.     Findings: No ecchymosis, erythema or rash.  Neurological:     Mental Status: He is alert and oriented to person, place, and time.  Psychiatric:        Mood and Affect: Mood is anxious.     ED Results / Procedures / Treatments   Labs (all labs ordered are listed, but only abnormal results are displayed) Labs Reviewed  BASIC METABOLIC PANEL - Abnormal; Notable for the following components:      Result Value   Potassium 3.4 (*)    Glucose, Bld 115 (*)    All other components within normal limits  CBC  TROPONIN I (HIGH SENSITIVITY)    EKG EKG Interpretation  Date/Time:  Tuesday June 25 2022 16:01:57 EST Ventricular Rate:  95 PR Interval:  148 QRS Duration: 94 QT Interval:  342 QTC Calculation: 429 R Axis:   67 Text Interpretation: Normal sinus rhythm T wave abnormality, consider inferior ischemia Abnormal ECG When compared with ECG of 06-Jun-2022 10:23, No significant change since last tracing  Confirmed by Meridee Score [phone]) on 06/25/2022 4:11:04 PM  Radiology DG Chest 2 View  Result Date: 06/25/2022 CLINICAL DATA:  chest pain EXAM: CHEST - 2 VIEW COMPARISON:  06/09/2022 FINDINGS: Cardiac silhouette is unremarkable. No pneumothorax or pleural effusion. The lungs are clear. The visualized skeletal structures are unremarkable. IMPRESSION: No acute cardiopulmonary process. Electronically Signed   By: Layla Maw M.D.   On: 06/25/2022 16:27    Procedures Procedures    Medications Ordered in ED  Medications  ipratropium-albuterol (DUONEB) 0.5-2.5 (3) MG/3ML nebulizer solution 3 mL (3 mLs Nebulization Given 06/25/22 1928)  aerochamber plus with mask device 1 each (has no administration in time range)  ketorolac (TORADOL) 15 MG/ML injection 15 mg (has no administration in time range)    ED Course/ Medical Decision Making/ A&P Clinical Course as of 06/25/22 1932  Tue Jun 25, 2022  1916 F/u duoneb [CR]    Clinical Course User Index [CR] Peter Garter, PA                           Medical Decision Making Amount and/or Complexity of Data Reviewed Labs: ordered. Radiology: ordered.  Risk OTC drugs. Prescription drug management.   37 y.o. male presents to the ED for concern of Chest Pain and Cough   This involves an extensive number of treatment options, and is a complaint that carries with it a high risk of complications and morbidity.     Past Medical History / Co-morbidities / Social History: Hx of recent URI symptoms.  Also hx of anxiety. Social Determinants of Health include: None  Additional History:  Obtained by chart review.  Notably recent ED visits for similar symptoms, see for details.  Lab Tests: I ordered, and personally interpreted labs.  The pertinent results include:   BMP without evidence of electrolyte derangement or AKI Troponin 3 CBC without elevated WBC or evidence of anemia EKG without significant change since last tracing, no  significant ST elevation or depression appreciated.  Imaging Studies: I ordered imaging studies including CXR.   I independently visualized and interpreted imaging which showed no evidence of acute cardiopulmonary pathology such as pneumothorax, pneumonia, rib fracture, cardiomegaly, pleural effusion I agree with the radiologist interpretation.  ED Course: Pt well-appearing on exam.  Presenting with prolonged cough and chest tightness over the last 3 weeks.  Initially diagnosed with viral URI, with more symptoms including congestion, postnasal drip, sore throat, sinus headaches.  Most symptoms have resolved with the exception of the cough and mild chest tightness which have stayed consistent.  Is also reported some occasional moments of lightheadedness with coughing fits that relieves shortly after coughing subsides.  Has been treated with amoxicillin for dental concern and doxycycline for URI.  Was also provided a short course of steroids, Mucinex, Tessalon Perles, and albuterol, though patient states he has not been using these.  No significant change or sudden worsening of symptoms.  Tested negative for COVID on 06/09/2022.  No hx of asthma or lung disease.   Mild wheezing appreciated exam, though with very good air movement and equal chest rise appreciated.  Satting between 97-100% on room air.  Without tenderness of rib cage or chest wall other than near the mid sternum.  No crepitus, swelling, or ecchymosis appreciated of that area.  No Hx of DVT, not tachycardic.  PERC negative.  Wells criteria of 0, low risk for PE.  CXR without evidence of pneumothorax, pneumonia, or rib fracture.  EKG without acute changes, troponin unremarkable.  Chest pain not exertional.  Presentation provides low suspicion for GERD, PUD, or ACS.  Pt afebrile without tonsillar exudate, erythema, or swelling.  Presents without cervical lymphadenopathy or dysphagia.  Presentation non concerning for PTA or RPA.  No trismus or  uvula deviation.  History and presentation provides low suspicion for acute strep pharyngitis.   Clinical picture suggestive of postviral cough syndrome.  No further abx indicated.   Pt does not  appear dehydrated, but did discuss importance of water rehydration.  Recommend DuoNeb treatment and short course of steroids, in addition to conservative symptom management.  Provided further education on correct/proper use of prescriptions.  AeroChamber for better albuterol utilization ordered in ED.  Plan to reassess after DuoNeb.  Anticipate discharge home with close PCP follow up.  Disposition: 1915 care of Herby AbrahamMarlin Salley transferred to [employer] Cooper Robbins at the end of my shift as the patient will require reassessment once labs/imaging have resulted.  Patient presentation, ED course, and plan of care discussed with review of all pertinent labs and imaging.  Please see his/her note for further details regarding further ED course and disposition.   Plan at time of handoff is to reassess after breathing treatment.  If improvement, plan to continue with conservative/outpatient treatment with close PCP follow-up.  If without any improvement, may consider further evaluation/workup.  Anticipate discharge home.  This may be altered or completely changed at the discretion of the oncoming team pending results of further workup.  This chart was dictated using voice recognition software.  Despite best efforts to proofread, errors can occur which can change the documentation meaning.         Final Clinical Impression(s) / ED Diagnoses Final diagnoses:  Cough, unspecified type  Nonspecific chest pain    Rx / DC Orders ED Discharge Orders          Ordered    guaiFENesin (ROBITUSSIN) 100 MG/5ML liquid  Every 4 hours PRN        06/25/22 1948    methylPREDNISolone (MEDROL DOSEPAK) 4 MG TBPK tablet        06/25/22 1948    loratadine (CLARITIN) 10 MG tablet  Daily        06/25/22 1948    albuterol (VENTOLIN  HFA) 108 (90 Base) MCG/ACT inhaler  Every 6 hours PRN        06/25/22 1948              Cecil CobbsCockerham, Tessla Spurling M, PA-C 06/25/22 1954    Cecil Cobbsockerham, Quanika Solem M, PA-C 06/25/22 1955    Terrilee FilesButler, Michael C, MD 06/26/22 1110

## 2022-06-28 ENCOUNTER — Emergency Department (HOSPITAL_COMMUNITY)
Admission: EM | Admit: 2022-06-28 | Discharge: 2022-06-28 | Disposition: A | Discharge status: Home / Self Care | Payer: 59 | Attending: Emergency Medicine | Admitting: Emergency Medicine

## 2022-06-28 ENCOUNTER — Encounter (HOSPITAL_COMMUNITY): Payer: Self-pay

## 2022-06-28 ENCOUNTER — Other Ambulatory Visit: Payer: Self-pay

## 2022-06-28 DIAGNOSIS — J0191 Acute recurrent sinusitis, unspecified: Secondary | ICD-10-CM | POA: Diagnosis not present

## 2022-06-28 DIAGNOSIS — J019 Acute sinusitis, unspecified: Secondary | ICD-10-CM | POA: Diagnosis not present

## 2022-06-28 DIAGNOSIS — R0602 Shortness of breath: Secondary | ICD-10-CM | POA: Diagnosis present

## 2022-06-28 MED ORDER — AMOXICILLIN-POT CLAVULANATE 875-125 MG PO TABS: 2.0000 | ORAL_TABLET | Freq: Two times a day (BID) | ORAL | 0 refills | Status: AC

## 2022-06-28 NOTE — ED Notes (Signed)
Resting HR 89 and SpO2 99%. After ambulating HR 107 and SpO2 98%.

## 2022-06-28 NOTE — ED Triage Notes (Signed)
Pt BIB EMS with reports of SHOB and cough x 1 week. Hx of bronchitis.

## 2022-06-28 NOTE — Discharge Instructions (Signed)
Antibiotics as prescribed and complete the full course. Follow up with ENT, call to schedule an appointment. Recheck with your PCP.

## 2022-06-28 NOTE — ED Provider Notes (Signed)
[facility]-EMERGENCY DEPT Provider Note   CSN: [id number] Arrival date & time: 06/28/22  4098     History  Chief Complaint  Patient presents with   Cough   Shortness of Breath    Jesse Mckinney is a 36 y.o. male.  36 y.o. male with a history of Anxiety and GERD who presents today for SOB. Pt states that he woke up last night feeling dizzy and called 911. He has been having dark nasal discharge and frontal sinus pressure. He states that his SOB comes and goes. Taking his albuterol inhaler and Toprol helps relieve the SOB. This is his second ED visit this week (4th visit this month) for a similar complaint. He denies chest pain, palpitations, dysuria, hematuria, constipation, diarrhea, blood in the stool, fever or sick contacts.       Home Medications Prior to Admission medications   Medication Sig Start Date End Date Taking? Authorizing Provider  amoxicillin-clavulanate (AUGMENTIN) 875-125 MG tablet Take 2 tablets by mouth 2 (two) times daily for 14 days. 06/28/22 07/12/22 Yes Jeannie Fend, PA-C  albuterol (VENTOLIN HFA) 108 (90 Base) MCG/ACT inhaler Inhale 1-2 puffs into the lungs every 6 (six) hours as needed for wheezing or shortness of breath. 06/25/22   Cecil Cobbs, PA-C  ALPRAZolam Prudy Feeler) 0.5 MG tablet Take 0.5 mg by mouth daily as needed. Patient not taking: Reported on 01/18/2022 12/08/21   [provider]  benzonatate (TESSALON) 100 MG capsule Take 1 capsule (100 mg total) by mouth 3 (three) times daily as needed for cough. 06/06/22   Lonell Grandchild, MD  busPIRone (BUSPAR) 10 MG tablet Take 10 mg by mouth daily. 10/01/21   [provider]  CLOMID 50 MG tablet Take 25 mg by mouth daily. 12/08/21   [provider]  Fiber Adult Gummies 2 g CHEW Chew 1 Dose by mouth daily.    [provider]  guaiFENesin (ROBITUSSIN) 100 MG/5ML liquid Take 5-10 mLs (100-200 mg total) by mouth every 4 (four) hours as needed for  cough or to loosen phlegm. 06/25/22   Cecil Cobbs, PA-C  loratadine (CLARITIN) 10 MG tablet Take 1 tablet (10 mg total) by mouth daily. 06/25/22   Cecil Cobbs, PA-C  methylPREDNISolone (MEDROL DOSEPAK) 4 MG TBPK tablet Take as directed 06/25/22   Cecil Cobbs, PA-C  metoprolol succinate (TOPROL XL) 25 MG 24 hr tablet Take 1 tablet (25 mg total) by mouth at bedtime. 10/09/21 10/10/22  Swinyer, Zachary George, NP  Multiple Vitamin (ONE-A-DAY MENS PO) Take 1 Dose by mouth daily. One-A-Day Men's gummy vitamin    [provider]  pantoprazole (PROTONIX) 20 MG tablet Take 20 mg by mouth daily as needed for heartburn. 09/27/21   [provider]      Allergies    Nitrostat [nitroglycerin], Iodinated contrast media, Iodine, and Mushroom extract complex    Review of Systems   Review of Systems Negative except as per HPI Physical Exam Updated Vital Signs BP 139/89 (BP Location: Left Arm)   Pulse 67   Temp 98.6 F (37 C) (Oral)   Resp 16   SpO2 96%  Physical Exam Vitals and nursing note reviewed.  Constitutional:      General: He is not in acute distress.    Appearance: He is well-developed. He is obese. He is not diaphoretic.  HENT:     Head: Normocephalic and atraumatic.     Nose: Congestion present.     Mouth/Throat:  Mouth: Mucous membranes are moist.  Eyes:     Conjunctiva/sclera: Conjunctivae normal.  Cardiovascular:     Rate and Rhythm: Normal rate and regular rhythm.     Heart sounds: Normal heart sounds.  Pulmonary:     Effort: Pulmonary effort is normal.     Breath sounds: Normal breath sounds.  Musculoskeletal:     Cervical back: Neck supple.     Right lower leg: No edema.     Left lower leg: No edema.  Lymphadenopathy:     Cervical: No cervical adenopathy.  Skin:    General: Skin is warm and dry.  Neurological:     Mental Status: He is alert and oriented to person, place, and time.  Psychiatric:        Behavior: Behavior normal.      ED Results / Procedures / Treatments   Labs (all labs ordered are listed, but only abnormal results are displayed) Labs Reviewed - No data to display  EKG None  Radiology No results found.  Procedures Procedures    Medications Ordered in ED Medications - No data to display  ED Course/ Medical Decision Making/ A&P                           Medical Decision Making  36 year old male presents with concern for ongoing sinus congestion and pressure.  Patient is here for his fourth visit this month.  He is recently completed doxycycline, prednisone.  Has tried guanfacine, Tessalon, albuterol inhaler for his symptoms without resolution.  He presents with a tissue containing brown/blood-tinged nasal drainage which is thick.  States that last night he felt short of breath and was wheezing, called EMS however on their arrival, his symptoms resolved.  Question anxiety component to his illness.  Concern for recurrent sinusitis given his persistent symptoms, will cover with Augmentin.  Provided with referral to ENT and encouraged to follow-up with his primary care provider.        Final Clinical Impression(s) / ED Diagnoses Final diagnoses:  Acute recurrent sinusitis, unspecified location    Rx / DC Orders ED Discharge Orders          Ordered    amoxicillin-clavulanate (AUGMENTIN) 875-125 MG tablet  2 times daily        06/28/22 0839              Jeannie Fend, PA-C 06/28/22 0865    Derwood Kaplan, MD 06/28/22 1423

## 2022-11-16 ENCOUNTER — Other Ambulatory Visit: Payer: Self-pay

## 2022-11-16 ENCOUNTER — Emergency Department (HOSPITAL_COMMUNITY): Payer: BLUE CROSS/BLUE SHIELD

## 2022-11-16 ENCOUNTER — Emergency Department (HOSPITAL_COMMUNITY)
Admission: EM | Admit: 2022-11-16 | Discharge: 2022-11-16 | Disposition: A | Discharge status: Home / Self Care | Payer: BLUE CROSS/BLUE SHIELD | Attending: Emergency Medicine | Admitting: Emergency Medicine

## 2022-11-16 ENCOUNTER — Encounter (HOSPITAL_COMMUNITY): Payer: Self-pay

## 2022-11-16 DIAGNOSIS — I10 Essential (primary) hypertension: Secondary | ICD-10-CM | POA: Diagnosis not present

## 2022-11-16 DIAGNOSIS — Z79899 Other long term (current) drug therapy: Secondary | ICD-10-CM | POA: Diagnosis not present

## 2022-11-16 DIAGNOSIS — R1031 Right lower quadrant pain: Secondary | ICD-10-CM | POA: Diagnosis present

## 2022-11-16 LAB — COMPREHENSIVE METABOLIC PANEL
ALT: 17 U/L (ref 0–44)
AST: 16 U/L (ref 15–41)
Albumin: 3.7 g/dL (ref 3.5–5.0)
Alkaline Phosphatase: 65 U/L (ref 38–126)
Anion gap: 7 (ref 5–15)
BUN: 15 mg/dL (ref 6–20)
CO2: 26 mmol/L (ref 22–32)
Calcium: 8.6 mg/dL — ABNORMAL LOW (ref 8.9–10.3)
Chloride: 108 mmol/L (ref 98–111)
Creatinine, Ser: 1.04 mg/dL (ref 0.61–1.24)
GFR, Estimated: >60 mL/min (ref >60)
Glucose, Bld: 112 mg/dL — ABNORMAL HIGH (ref 70–99)
Potassium: 4.4 mmol/L (ref 3.5–5.1)
Sodium: 141 mmol/L (ref 135–145)
Total Bilirubin: 0.8 mg/dL (ref 0.3–1.2)
Total Protein: 7 g/dL (ref 6.5–8.1)

## 2022-11-16 LAB — URINALYSIS, ROUTINE W REFLEX MICROSCOPIC
Bilirubin Urine: NEGATIVE
Glucose, UA: NEGATIVE mg/dL
Hgb urine dipstick: NEGATIVE
Ketones, ur: NEGATIVE mg/dL
Leukocytes,Ua: NEGATIVE
Nitrite: NEGATIVE
Protein, ur: NEGATIVE mg/dL
Specific Gravity, Urine: 1.011 (ref 1.005–1.030)
pH: 5 (ref 5.0–8.0)

## 2022-11-16 LAB — CBC WITH DIFFERENTIAL/PLATELET
Abs Immature Granulocytes: 0.03 10*3/uL (ref 0.00–0.07)
Basophils Absolute: 0 10*3/uL (ref 0.0–0.1)
Basophils Relative: 0 %
Eosinophils Absolute: 0.1 10*3/uL (ref 0.0–0.5)
Eosinophils Relative: 1 %
HCT: 44.2 % (ref 39.0–52.0)
Hemoglobin: 13.6 g/dL (ref 13.0–17.0)
Immature Granulocytes: 0 %
Lymphocytes Relative: 21 %
Lymphs Abs: 1.7 10*3/uL (ref 0.7–4.0)
MCH: 26.4 pg (ref 26.0–34.0)
MCHC: 30.8 g/dL (ref 30.0–36.0)
MCV: 85.7 fL (ref 80.0–100.0)
Monocytes Absolute: 0.6 10*3/uL (ref 0.1–1.0)
Monocytes Relative: 8 %
Neutro Abs: 5.4 10*3/uL (ref 1.7–7.7)
Neutrophils Relative %: 70 %
Platelets: 196 10*3/uL (ref 150–400)
RBC: 5.16 MIL/uL (ref 4.22–5.81)
RDW: 13.9 % (ref 11.5–15.5)
WBC: 7.8 10*3/uL (ref 4.0–10.5)
nRBC: 0 % (ref 0.0–0.2)

## 2022-11-16 LAB — LIPASE, BLOOD: Lipase: 32 U/L (ref 11–51)

## 2022-11-16 MED ORDER — DICYCLOMINE HCL 20 MG PO TABS: 20.0000 mg | ORAL_TABLET | Freq: Two times a day (BID) | ORAL | 0 refills | Status: DC

## 2022-11-16 MED ORDER — ONDANSETRON 4 MG PO TBDP: 4.0000 mg | ORAL_TABLET | Freq: Three times a day (TID) | ORAL | 0 refills | Status: DC | PRN

## 2022-11-16 MED ORDER — ONDANSETRON HCL 4 MG/2ML IJ SOLN
4.0000 mg | Freq: Once | INTRAMUSCULAR | Status: AC
  Administered 2022-11-16: 4 mg via INTRAVENOUS
  Filled 2022-11-16: qty 2

## 2022-11-16 MED ORDER — LACTATED RINGERS IV BOLUS
1000.0000 mL | Freq: Once | INTRAVENOUS | Status: AC
  Administered 2022-11-16: 1000 mL via INTRAVENOUS

## 2022-11-16 MED ORDER — MORPHINE SULFATE (PF) 4 MG/ML IV SOLN
4.0000 mg | Freq: Once | INTRAVENOUS | Status: AC
  Administered 2022-11-16: 4 mg via INTRAVENOUS
  Filled 2022-11-16: qty 1

## 2022-11-16 NOTE — ED Triage Notes (Signed)
Pt presents to ED for evaluation of RLQ pain radiating upward for several days. Denies vomiting.

## 2022-11-16 NOTE — Discharge Instructions (Signed)
Your workup today was reassuring.  CT scan did not show any concerning findings.  Blood work was reassuring.  Urine did not show any signs of infection.  I have sent in a couple medications to your pharmacy.  Take these as needed for pain and nausea.  Follow-up with the primary care doctor.  Have attached information for [facility].  If you have any concerning or worsening symptoms return to the emergency department.

## 2022-11-16 NOTE — ED Provider Notes (Signed)
EMERGENCY DEPARTMENT AT [facility] Provider Note   CSN: [id number] Arrival date & time: 11/16/22  4401     History  Chief Complaint  Patient presents with   Abdominal Pain    Jesse Mckinney is a 37 y.o. male.  37 year old male presents today for evaluation of right lower quadrant abdominal pain that has been ongoing for the past 2 days.  States he took some aspirin yesterday without relief.  Denies any associated nausea vomiting, dysuria, changes in bowel habits, fever.  No prior abdominal surgery.  Reports good p.o. intake.  The history is provided by the patient. No language interpreter was used.       Home Medications Prior to Admission medications   Medication Sig Start Date End Date Taking? Authorizing Provider  albuterol (VENTOLIN HFA) 108 (90 Base) MCG/ACT inhaler Inhale 1-2 puffs into the lungs every 6 (six) hours as needed for wheezing or shortness of breath. 06/25/22   Cecil Cobbs, PA-C  ALPRAZolam Prudy Feeler) 0.5 MG tablet Take 0.5 mg by mouth daily as needed. Patient not taking: Reported on 01/18/2022 12/08/21   [provider]  benzonatate (TESSALON) 100 MG capsule Take 1 capsule (100 mg total) by mouth 3 (three) times daily as needed for cough. 06/06/22   Lonell Grandchild, MD  busPIRone (BUSPAR) 10 MG tablet Take 10 mg by mouth daily. 10/01/21   [provider]  CLOMID 50 MG tablet Take 25 mg by mouth daily. 12/08/21   [provider]  Fiber Adult Gummies 2 g CHEW Chew 1 Dose by mouth daily.    [provider]  guaiFENesin (ROBITUSSIN) 100 MG/5ML liquid Take 5-10 mLs (100-200 mg total) by mouth every 4 (four) hours as needed for cough or to loosen phlegm. 06/25/22   Cecil Cobbs, PA-C  loratadine (CLARITIN) 10 MG tablet Take 1 tablet (10 mg total) by mouth daily. 06/25/22   Cecil Cobbs, PA-C  methylPREDNISolone (MEDROL DOSEPAK) 4 MG TBPK tablet Take as directed 06/25/22   Cecil Cobbs,  PA-C  metoprolol succinate (TOPROL XL) 25 MG 24 hr tablet Take 1 tablet (25 mg total) by mouth at bedtime. 10/09/21 10/10/22  Swinyer, Zachary George, NP  Multiple Vitamin (ONE-A-DAY MENS PO) Take 1 Dose by mouth daily. One-A-Day Men's gummy vitamin    [provider]  pantoprazole (PROTONIX) 20 MG tablet Take 20 mg by mouth daily as needed for heartburn. 09/27/21   [provider]      Allergies    Nitrostat [nitroglycerin], Iodinated contrast media, Iodine, and Mushroom extract complex    Review of Systems   Review of Systems  Constitutional:  Negative for fever.  Gastrointestinal:  Positive for abdominal pain. Negative for blood in stool, constipation, diarrhea, nausea and vomiting.  Genitourinary:  Negative for dysuria and flank pain.  All other systems reviewed and are negative.   Physical Exam Updated Vital Signs BP (!) 146/71   Pulse 70   Temp 98.4 F (36.9 C) (Oral)   Resp 16   Ht 6\' 6"  (1.981 m)   Wt (!) 208.7 kg   SpO2 99%   BMI 53.16 kg/m  Physical Exam Vitals and nursing note reviewed.  Constitutional:      General: He is not in acute distress.    Appearance: Normal appearance. He is obese. He is not ill-appearing.  HENT:     Head: Normocephalic and atraumatic.     Nose: Nose normal.  Eyes:  General: No scleral icterus.    Extraocular Movements: Extraocular movements intact.     Conjunctiva/sclera: Conjunctivae normal.  Cardiovascular:     Rate and Rhythm: Normal rate and regular rhythm.     Pulses: Normal pulses.     Heart sounds: Normal heart sounds.  Pulmonary:     Effort: Pulmonary effort is normal. No respiratory distress.     Breath sounds: Normal breath sounds. No wheezing or rales.  Abdominal:     General: There is no distension.     Palpations: Abdomen is soft.     Tenderness: There is no abdominal tenderness. There is no right CVA tenderness, left CVA tenderness, guarding or rebound.  Musculoskeletal:        General: Normal range  of motion.     Cervical back: Normal range of motion.  Skin:    General: Skin is warm and dry.  Neurological:     General: No focal deficit present.     Mental Status: He is alert. Mental status is at baseline.     ED Results / Procedures / Treatments   Labs (all labs ordered are listed, but only abnormal results are displayed) Labs Reviewed  COMPREHENSIVE METABOLIC PANEL - Abnormal; Notable for the following components:      Result Value   Glucose, Bld 112 (*)    Calcium 8.6 (*)    All other components within normal limits  URINALYSIS, ROUTINE W REFLEX MICROSCOPIC - Abnormal; Notable for the following components:   Color, Urine STRAW (*)    All other components within normal limits  CBC WITH DIFFERENTIAL/PLATELET  LIPASE, BLOOD    EKG None  Radiology CT ABDOMEN PELVIS WO CONTRAST  Result Date: 11/16/2022 CLINICAL DATA:  Acute abdominal pain EXAM: CT ABDOMEN AND PELVIS WITHOUT CONTRAST TECHNIQUE: Multidetector CT imaging of the abdomen and pelvis was performed following the standard protocol without IV contrast. RADIATION DOSE REDUCTION: This exam was performed according to the departmental dose-optimization program which includes automated exposure control, adjustment of the mA and/or kV according to patient size and/or use of iterative reconstruction technique. COMPARISON:  None Available. FINDINGS: Lower chest: No acute abnormality. Hepatobiliary: No focal liver abnormality is seen. No gallstones, gallbladder wall thickening, or biliary dilatation. Pancreas: Unremarkable. No pancreatic ductal dilatation or surrounding inflammatory changes. Spleen: Normal in size without focal abnormality. Adrenals/Urinary Tract: Adrenal glands are within normal limits. Kidneys demonstrate no renal calculi or urinary tract obstructive changes. Simple appearing cysts are noted bilaterally. The largest of these is seen on the left measuring up to 4.8 cm. No further follow-up is recommended. No  obstructive changes are seen. The bladder is partially distended. Stomach/Bowel: Appendix is within normal limits. No obstructive or inflammatory changes of colon are seen. Stomach and small bowel are within normal limits. Vascular/Lymphatic: No significant vascular findings are present. No enlarged abdominal or pelvic lymph nodes. Reproductive: Prostate is unremarkable. Other: No abdominal wall hernia or abnormality. No abdominopelvic ascites. Musculoskeletal: No acute or significant osseous findings. IMPRESSION: No acute abnormality is noted to correspond with the given clinical history of Electronically Signed   By: Alcide Clever M.D.   On: 11/16/2022 09:41    Procedures Procedures    Medications Ordered in ED Medications  morphine (PF) 4 MG/ML injection 4 mg (4 mg Intravenous Given 11/16/22 0721)  lactated ringers bolus 1,000 mL (0 mLs Intravenous Stopped 11/16/22 0836)  ondansetron (ZOFRAN) injection 4 mg (4 mg Intravenous Given 11/16/22 4098)    ED Course/ Medical Decision Making/  A&P                             Medical Decision Making Amount and/or Complexity of Data Reviewed Labs: ordered. Radiology: ordered.  Risk Prescription drug management.   Medical Decision Making / ED Course   This patient presents to the ED for concern of right lower quadrant abdominal pain, this involves an extensive number of treatment options, and is a complaint that carries with it a high risk of complications and morbidity.  The differential diagnosis includes appendicitis, UTI, pyelonephritis, nephrolithiasis, gastroenteritis, constipation, pancreatitis  MDM: 37 year old male presents today for evaluation of abdominal pain.  Ongoing for the past few days.  Afebrile.  Normal vital signs with the exception of mild hypertension.  Blood work overall reassuring.  LFTs normal.  No leukocytosis.  Low suspicion for appendicitis.  However CT obtained.  No evidence of kidney stone, or appendicitis or other  acute inflammatory or infectious process.  Following symptomatic management patient reports significant improvement and is without pain.  Will prescribe Bentyl and Zofran and provide referral to PCP.  Patient is in agreement with this plan.  Patient is appropriate for discharge.  Discharged in stable condition.  UA without evidence of UTI.   Lab Tests: -I ordered, reviewed, and interpreted labs.   The pertinent results include:   Labs Reviewed  COMPREHENSIVE METABOLIC PANEL - Abnormal; Notable for the following components:      Result Value   Glucose, Bld 112 (*)    Calcium 8.6 (*)    All other components within normal limits  URINALYSIS, ROUTINE W REFLEX MICROSCOPIC - Abnormal; Notable for the following components:   Color, Urine STRAW (*)    All other components within normal limits  CBC WITH DIFFERENTIAL/PLATELET  LIPASE, BLOOD      EKG  EKG Interpretation  Date/Time:    Ventricular Rate:    PR Interval:    QRS Duration:   QT Interval:    QTC Calculation:   R Axis:     Text Interpretation:           Imaging Studies ordered: I ordered imaging studies including CT abdomen pelvis without contrast given contrast allergy. I independently visualized and interpreted imaging. I agree with the radiologist interpretation   Medicines ordered and prescription drug management: Meds ordered this encounter  Medications   morphine (PF) 4 MG/ML injection 4 mg   lactated ringers bolus 1,000 mL   ondansetron (ZOFRAN) injection 4 mg    -I have reviewed the patients home medicines and have made adjustments as needed  Critical interventions Fluids, pain control   Reevaluation: After the interventions noted above, I reevaluated the patient and found that they have :resolved  Co morbidities that complicate the patient evaluation  Past Medical History:  Diagnosis Date   Anxiety    Chest pain    GERD (gastroesophageal reflux disease)    Palpitations    Panic attack        Dispostion: Patient is appropriate for discharge.  Discharged in stable condition.  Return precautions discussed.   Final Clinical Impression(s) / ED Diagnoses Final diagnoses:  Right lower quadrant abdominal pain    Rx / DC Orders ED Discharge Orders          Ordered    dicyclomine (BENTYL) 20 MG tablet  2 times daily        11/16/22 1031    ondansetron (ZOFRAN-ODT) 4 MG disintegrating  tablet  Every 8 hours PRN        11/16/22 1031              Marita [place], [place] 11/16/22 1033    Tanda Rockers A, DO 11/16/22 [phone]

## 2022-11-16 NOTE — ED Notes (Signed)
Pt educated on need for urine sample, urinal at bedside.

## 2023-01-15 ENCOUNTER — Encounter (HOSPITAL_BASED_OUTPATIENT_CLINIC_OR_DEPARTMENT_OTHER): Payer: Self-pay | Admitting: Emergency Medicine

## 2023-01-15 ENCOUNTER — Other Ambulatory Visit: Payer: Self-pay

## 2023-01-15 ENCOUNTER — Emergency Department (HOSPITAL_BASED_OUTPATIENT_CLINIC_OR_DEPARTMENT_OTHER)
Admission: EM | Admit: 2023-01-15 | Discharge: 2023-01-15 | Disposition: A | Discharge status: Home / Self Care | Payer: BLUE CROSS/BLUE SHIELD | Attending: Emergency Medicine | Admitting: Emergency Medicine

## 2023-01-15 ENCOUNTER — Emergency Department (HOSPITAL_BASED_OUTPATIENT_CLINIC_OR_DEPARTMENT_OTHER): Payer: BLUE CROSS/BLUE SHIELD

## 2023-01-15 DIAGNOSIS — R079 Chest pain, unspecified: Secondary | ICD-10-CM | POA: Diagnosis present

## 2023-01-15 LAB — CBC
HCT: 43 % (ref 39.0–52.0)
Hemoglobin: 13.5 g/dL (ref 13.0–17.0)
MCH: 26.1 pg (ref 26.0–34.0)
MCHC: 31.4 g/dL (ref 30.0–36.0)
MCV: 83.2 fL (ref 80.0–100.0)
Platelets: 215 10*3/uL (ref 150–400)
RBC: 5.17 MIL/uL (ref 4.22–5.81)
RDW: 14 % (ref 11.5–15.5)
WBC: 8.9 10*3/uL (ref 4.0–10.5)
nRBC: 0 % (ref 0.0–0.2)

## 2023-01-15 LAB — BASIC METABOLIC PANEL
Anion gap: 6 (ref 5–15)
BUN: 13 mg/dL (ref 6–20)
CO2: 26 mmol/L (ref 22–32)
Calcium: 8.6 mg/dL — ABNORMAL LOW (ref 8.9–10.3)
Chloride: 106 mmol/L (ref 98–111)
Creatinine, Ser: 0.76 mg/dL (ref 0.61–1.24)
GFR, Estimated: >60 mL/min (ref >60)
Glucose, Bld: 117 mg/dL — ABNORMAL HIGH (ref 70–99)
Potassium: 3.4 mmol/L — ABNORMAL LOW (ref 3.5–5.1)
Sodium: 138 mmol/L (ref 135–145)

## 2023-01-15 LAB — TROPONIN I (HIGH SENSITIVITY): Troponin I (High Sensitivity): 3 ng/L (ref <18)

## 2023-01-15 NOTE — Discharge Instructions (Addendum)
Please follow-up with your primary care doctor, on the discharge papers there will be a number to call to help establish primary care doctor.  If you have worsening chest pain, shortness of breath, nausea, vomiting that is intractable please return to the ER.

## 2023-01-15 NOTE — ED Triage Notes (Signed)
Patient arrived via [facility] c/o chest pain w/ anxiety x 20 min. Patient states was on way to work when his chest felt tight. Called EMS from apartment. Patient is AO x 4, VS w/ elevated BP, slow gait.

## 2023-01-15 NOTE — ED Notes (Signed)
D/c paperwork reviewed with pt, including follow up care.  No questions or concerns voiced at time of d/c. . Pt verbalized understanding, Ambulatory with family to ED exit, NAD.   

## 2023-01-15 NOTE — ED Provider Notes (Signed)
[place] EMERGENCY DEPARTMENT AT [facility] Provider Note   CSN: [id number] Arrival date & time: 01/15/23  2041     History  Chief Complaint  Patient presents with   Chest Pain   Anxiety    Jesse Mckinney is a 37 y.o. male, history of anxiety, GERD, who presents to the ED secondary to chest pain that started around 7:00 today.  He states that the chest pain last for an hour, was in the middle of his chest, and stabbing, and has slowly resolved.  He had a little bit of shortness of breath initially, but this resolved and the chest pain persisted for about another hour.  He states he has been very anxious lately, and been worrying about having a primary care doctor, and his weight gain.  Denies any kind of recent illnesses, fever, chills, shortness of breath when walking.  He did not have the radiation of the chest pain to his back, jaw, or arm.    Home Medications Prior to Admission medications   Medication Sig Start Date End Date Taking? Authorizing Provider  albuterol (VENTOLIN HFA) 108 (90 Base) MCG/ACT inhaler Inhale 1-2 puffs into the lungs every 6 (six) hours as needed for wheezing or shortness of breath. 06/25/22   Cecil Cobbs, PA-C  ALPRAZolam Prudy Feeler) 0.5 MG tablet Take 0.5 mg by mouth daily as needed. Patient not taking: Reported on 01/18/2022 12/08/21   [provider]  benzonatate (TESSALON) 100 MG capsule Take 1 capsule (100 mg total) by mouth 3 (three) times daily as needed for cough. 06/06/22   Lonell Grandchild, MD  busPIRone (BUSPAR) 10 MG tablet Take 10 mg by mouth daily. 10/01/21   [provider]  CLOMID 50 MG tablet Take 25 mg by mouth daily. 12/08/21   [provider]  dicyclomine (BENTYL) 20 MG tablet Take 1 tablet (20 mg total) by mouth 2 (two) times daily. 11/16/22   Karie Mainland, Amjad, PA-C  Fiber Adult Gummies 2 g CHEW Chew 1 Dose by mouth daily.    [provider]  guaiFENesin (ROBITUSSIN) 100 MG/5ML liquid Take  5-10 mLs (100-200 mg total) by mouth every 4 (four) hours as needed for cough or to loosen phlegm. 06/25/22   Cecil Cobbs, PA-C  loratadine (CLARITIN) 10 MG tablet Take 1 tablet (10 mg total) by mouth daily. 06/25/22   Cecil Cobbs, PA-C  methylPREDNISolone (MEDROL DOSEPAK) 4 MG TBPK tablet Take as directed 06/25/22   Cecil Cobbs, PA-C  metoprolol succinate (TOPROL XL) 25 MG 24 hr tablet Take 1 tablet (25 mg total) by mouth at bedtime. 10/09/21 10/10/22  Swinyer, Zachary George, NP  Multiple Vitamin (ONE-A-DAY MENS PO) Take 1 Dose by mouth daily. One-A-Day Men's gummy vitamin    [provider]  ondansetron (ZOFRAN-ODT) 4 MG disintegrating tablet Take 1 tablet (4 mg total) by mouth every 8 (eight) hours as needed. 11/16/22   Karie Mainland, Amjad, PA-C  pantoprazole (PROTONIX) 20 MG tablet Take 20 mg by mouth daily as needed for heartburn. 09/27/21   [provider]      Allergies    Nitrostat [nitroglycerin], Iodinated contrast media, Iodine, and Mushroom extract complex    Review of Systems   Review of Systems  Respiratory:  Negative for shortness of breath.   Cardiovascular:  Positive for chest pain.    Physical Exam Updated Vital Signs BP (!) 153/79 (BP Location: Right Wrist)   Pulse 82   Temp 99.1 F (37.3 C)  Resp 20   Ht 6\' 6"  (1.981 m)   Wt (!) 222.3 kg   SpO2 98%   BMI 56.63 kg/m  Physical Exam Vitals and nursing note reviewed.  Constitutional:      General: He is not in acute distress.    Appearance: He is well-developed. He is obese.  HENT:     Head: Normocephalic and atraumatic.  Eyes:     Conjunctiva/sclera: Conjunctivae normal.  Cardiovascular:     Rate and Rhythm: Normal rate and regular rhythm.     Heart sounds: No murmur heard. Pulmonary:     Effort: Pulmonary effort is normal. No respiratory distress.     Breath sounds: Normal breath sounds.  Abdominal:     Palpations: Abdomen is soft.     Tenderness: There is no abdominal  tenderness.  Musculoskeletal:        General: No swelling.     Cervical back: Neck supple.  Skin:    General: Skin is warm and dry.     Capillary Refill: Capillary refill takes less than 2 seconds.  Neurological:     Mental Status: He is alert.  Psychiatric:        Mood and Affect: Mood normal.     ED Results / Procedures / Treatments   Labs (all labs ordered are listed, but only abnormal results are displayed) Labs Reviewed  BASIC METABOLIC PANEL - Abnormal; Notable for the following components:      Result Value   Potassium 3.4 (*)    Glucose, Bld 117 (*)    Calcium 8.6 (*)    All other components within normal limits  CBC  TROPONIN I (HIGH SENSITIVITY)  TROPONIN I (HIGH SENSITIVITY)    EKG EKG Interpretation  Date/Time:  Wednesday January 15 2023 20:54:29 EDT Ventricular Rate:  83 PR Interval:  172 QRS Duration: 84 QT Interval:  354 QTC Calculation: 416 R Axis:   61 Text Interpretation: Sinus rhythm Low voltage, precordial leads Borderline T wave abnormalities No significant change since prior 11/23 Confirmed by Meridee Score [phone]) on 01/15/2023 9:14:09 PM  Radiology DG Chest 2 View  Result Date: 01/15/2023 CLINICAL DATA:  Chest pain with anxiety. EXAM: CHEST - 2 VIEW COMPARISON:  06/17/2022. FINDINGS: The heart size and mediastinal contours are within normal limits. Both lungs are clear. No acute osseous abnormality. IMPRESSION: No active cardiopulmonary disease. Electronically Signed   By: Thornell Sartorius M.D.   On: 01/15/2023 21:18    Procedures Procedures    Medications Ordered in ED Medications - No data to display  ED Course/ Medical Decision Making/ A&P             HEART Score: 2                Medical Decision Making Patient is a 37 year old male, here for chest pain, that began around 7:00.  He states that it last for about an hour or so, and is now resolved.  Initially has some shortness of breath, but this resolved before the chest pain resolved.   He notes he has been very anxious lately.  Will obtain CBC, CMP, troponins.  PERC negative.  Amount and/or Complexity of Data Reviewed Labs: ordered.    Details: Troponin 3 Radiology: ordered.    Details: Chest x-ray clear Discussion of management or test interpretation with external provider(s): Patient with heart score of 2, troponin negative, chest x-ray clear, chest pain subsided, over time, is feeling no problem now.  States he has  a history of panic attacks, and felt very similar to that.  We discussed risk factors, follow-up with primary care doctor, and he voiced understanding.  Discharged home.  Provided with number to call to help establish primary care doctor.    Final Clinical Impression(s) / ED Diagnoses Final diagnoses:  Chest pain, unspecified type    Rx / DC Orders ED Discharge Orders     None         Ellayna Hilligoss, [place], PA 01/15/23 2316    Terrilee Files, MD 01/16/23 1003

## 2023-04-03 ENCOUNTER — Ambulatory Visit
Admission: EM | Admit: 2023-04-03 | Discharge: 2023-04-03 | Disposition: A | Discharge status: Home / Self Care | Payer: BLUE CROSS/BLUE SHIELD | Attending: Internal Medicine | Admitting: Internal Medicine

## 2023-04-03 DIAGNOSIS — M79622 Pain in left upper arm: Secondary | ICD-10-CM

## 2023-04-03 DIAGNOSIS — S161XXA Strain of muscle, fascia and tendon at neck level, initial encounter: Secondary | ICD-10-CM | POA: Diagnosis not present

## 2023-04-03 MED ORDER — NAPROXEN 500 MG PO TABS: 500.0000 mg | ORAL_TABLET | Freq: Two times a day (BID) | ORAL | 0 refills | Status: DC

## 2023-04-03 MED ORDER — CYCLOBENZAPRINE HCL 5 MG PO TABS: 5.0000 mg | ORAL_TABLET | Freq: Three times a day (TID) | ORAL | 0 refills | Status: DC | PRN

## 2023-04-03 MED ORDER — KETOROLAC TROMETHAMINE 60 MG/2ML IM SOLN
60.0000 mg | Freq: Once | INTRAMUSCULAR | Status: AC
  Administered 2023-04-03: 60 mg via INTRAMUSCULAR
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
## 2023-05-05 ENCOUNTER — Encounter (HOSPITAL_BASED_OUTPATIENT_CLINIC_OR_DEPARTMENT_OTHER): Payer: Self-pay | Admitting: Emergency Medicine

## 2023-05-05 ENCOUNTER — Emergency Department (HOSPITAL_BASED_OUTPATIENT_CLINIC_OR_DEPARTMENT_OTHER): Payer: BLUE CROSS/BLUE SHIELD

## 2023-05-05 ENCOUNTER — Emergency Department (HOSPITAL_BASED_OUTPATIENT_CLINIC_OR_DEPARTMENT_OTHER)
Admission: EM | Admit: 2023-05-05 | Discharge: 2023-05-05 | Disposition: A | Discharge status: Home / Self Care | Payer: BLUE CROSS/BLUE SHIELD | Attending: Emergency Medicine | Admitting: Emergency Medicine

## 2023-05-05 ENCOUNTER — Other Ambulatory Visit: Payer: Self-pay

## 2023-05-05 DIAGNOSIS — X501XXA Overexertion from prolonged static or awkward postures, initial encounter: Secondary | ICD-10-CM | POA: Insufficient documentation

## 2023-05-05 DIAGNOSIS — S8391XA Sprain of unspecified site of right knee, initial encounter: Secondary | ICD-10-CM | POA: Insufficient documentation

## 2023-05-05 DIAGNOSIS — Y9301 Activity, walking, marching and hiking: Secondary | ICD-10-CM | POA: Diagnosis not present

## 2023-05-05 DIAGNOSIS — M25561 Pain in right knee: Secondary | ICD-10-CM | POA: Diagnosis present

## 2023-05-05 MED ORDER — DICLOFENAC SODIUM 75 MG PO TBEC: 75.0000 mg | DELAYED_RELEASE_TABLET | Freq: Two times a day (BID) | ORAL | 0 refills | Status: DC

## 2023-05-05 NOTE — ED Notes (Signed)
Patient transported to X-ray 

## 2023-05-05 NOTE — ED Provider Notes (Signed)
[place] [facility] Provider Note   CSN: [id number] Arrival date & time: 05/05/23  1656     History  Chief Complaint  Patient presents with   Knee Pain    Jesse Mckinney is a 37 y.o. male.  Patient reports he was walking 2 days ago and stepped on a rock.  Patient reports this caused him to twist and his right knee twisted.  Patient reports he has been having pain since.  Patient complains of discomfort and difficulty walking.  Patient has had previous bilateral ACL repairs.  No local orthopedist.  Patient denies any other area of injury.  The history is provided by the patient. No language interpreter was used.  Knee Pain      Home Medications Prior to Admission medications   Medication Sig Start Date End Date Taking? Authorizing Provider  diclofenac (VOLTAREN) 75 MG EC tablet Take 1 tablet (75 mg total) by mouth 2 (two) times daily. 05/05/23  Yes Cheron Schaumann K, PA-C  albuterol (VENTOLIN HFA) 108 (90 Base) MCG/ACT inhaler Inhale 1-2 puffs into the lungs every 6 (six) hours as needed for wheezing or shortness of breath. 06/25/22   Cecil Cobbs, PA-C  ALPRAZolam Prudy Feeler) 0.5 MG tablet Take 0.5 mg by mouth daily as needed. Patient not taking: Reported on 01/18/2022 12/08/21   [provider]  benzonatate (TESSALON) 100 MG capsule Take 1 capsule (100 mg total) by mouth 3 (three) times daily as needed for cough. 06/06/22   Lonell Grandchild, MD  busPIRone (BUSPAR) 10 MG tablet Take 10 mg by mouth daily. 10/01/21   [provider]  CLOMID 50 MG tablet Take 25 mg by mouth daily. 12/08/21   [provider]  cyclobenzaprine (FLEXERIL) 5 MG tablet Take 1 tablet (5 mg total) by mouth 3 (three) times daily as needed for muscle spasms. 04/03/23   Wallis Bamberg, PA-C  dicyclomine (BENTYL) 20 MG tablet Take 1 tablet (20 mg total) by mouth 2 (two) times daily. 11/16/22   Karie Mainland, Amjad, PA-C  Fiber Adult Gummies 2 g CHEW Chew 1 Dose by  mouth daily.    [provider]  guaiFENesin (ROBITUSSIN) 100 MG/5ML liquid Take 5-10 mLs (100-200 mg total) by mouth every 4 (four) hours as needed for cough or to loosen phlegm. 06/25/22   Cecil Cobbs, PA-C  loratadine (CLARITIN) 10 MG tablet Take 1 tablet (10 mg total) by mouth daily. 06/25/22   Cecil Cobbs, PA-C  methylPREDNISolone (MEDROL DOSEPAK) 4 MG TBPK tablet Take as directed 06/25/22   Cecil Cobbs, PA-C  metoprolol succinate (TOPROL XL) 25 MG 24 hr tablet Take 1 tablet (25 mg total) by mouth at bedtime. 10/09/21 10/10/22  Swinyer, Zachary George, NP  Multiple Vitamin (ONE-A-DAY MENS PO) Take 1 Dose by mouth daily. One-A-Day Men's gummy vitamin    [provider]  naproxen (NAPROSYN) 500 MG tablet Take 1 tablet (500 mg total) by mouth 2 (two) times daily with a meal. 04/03/23   Wallis Bamberg, PA-C  ondansetron (ZOFRAN-ODT) 4 MG disintegrating tablet Take 1 tablet (4 mg total) by mouth every 8 (eight) hours as needed. 11/16/22   Karie Mainland, Amjad, PA-C  pantoprazole (PROTONIX) 20 MG tablet Take 20 mg by mouth daily as needed for heartburn. 09/27/21   [provider]      Allergies    Nitrostat [nitroglycerin], Iodinated contrast media, Iodine, and Mushroom extract complex    Review of Systems   Review of Systems  All other systems reviewed and are negative.   Physical Exam Updated Vital Signs BP (!) 150/99 (BP Location: Right Arm)   Pulse 89   Temp 97.8 F (36.6 C)   Resp 18   Ht 6\' 6"  (1.981 m)   Wt (!) 222.3 kg   SpO2 98%   BMI 56.63 kg/m  Physical Exam Vitals and nursing note reviewed.  Constitutional:      Appearance: He is well-developed.  HENT:     Head: Normocephalic.  Pulmonary:     Effort: Pulmonary effort is normal.  Abdominal:     General: There is no distension.  Musculoskeletal:        General: Swelling and tenderness present.     Cervical back: Normal range of motion.     Comments: Tender right knee diffusely no medial  or lateral instability negative drawer neurovascular neurosensory intact  Neurological:     General: No focal deficit present.     Mental Status: He is alert and oriented to person, place, and time.     ED Results / Procedures / Treatments   Labs (all labs ordered are listed, but only abnormal results are displayed) Labs Reviewed - No data to display  EKG None  Radiology DG Knee Complete 4 Views Right  Result Date: 05/05/2023 CLINICAL DATA:  Right knee pain.  Injury. EXAM: RIGHT KNEE - COMPLETE 4+ VIEW COMPARISON:  None Available. FINDINGS: Prior ACL repair. No fracture or dislocation. There is tricompartmental peripheral spurring. Subchondral cysts in the central patella. No significant knee joint effusion. No erosive change or focal bone abnormality. IMPRESSION: 1. No acute fracture or dislocation. 2. Prior ACL repair. 3. Mild tricompartmental osteoarthritis. Electronically Signed   By: Narda Rutherford M.D.   On: 05/05/2023 18:11    Procedures Procedures    Medications Ordered in ED Medications - No data to display  ED Course/ Medical Decision Making/ A&P                                 Medical Decision Making She reports stepping on a rock and twisting his right knee patient complains of swelling and pain  Amount and/or Complexity of Data Reviewed Radiology: ordered.    Details: X-ray ordered reviewed and interpreted x-ray shows previous ACL repair.  Tricompartmental arthritis.  Risk Prescription drug management. Risk Details: Counseled on knee strain.  Patient is placed in a knee immobilizer and given crutches.  Patient is given a prescription for Voltaren.  He is advised to schedule to see the orthopedist for further evaluation.           Final Clinical Impression(s) / ED Diagnoses Final diagnoses:  Sprain of medial collateral ligament of right knee, initial encounter    Rx / DC Orders ED Discharge Orders          Ordered    diclofenac (VOLTAREN) 75  MG EC tablet  2 times daily        05/05/23 1841           An After Visit Summary was printed and given to the patient.    Elson Areas, PA-C 05/05/23 2250    Ernie Avena, MD 05/06/23 [phone]

## 2023-05-05 NOTE — Discharge Instructions (Addendum)
Return if any problems. Schedule to see the Orthopaedist .

## 2023-05-05 NOTE — ED Triage Notes (Signed)
Pt reports he was walking 2 days ago and stepped on a rock causing his RT knee to twist & "buckle", states he is having trouble walking

## 2023-05-08 ENCOUNTER — Emergency Department (HOSPITAL_COMMUNITY): Payer: BLUE CROSS/BLUE SHIELD

## 2023-05-08 ENCOUNTER — Encounter (HOSPITAL_COMMUNITY): Payer: Self-pay

## 2023-05-08 ENCOUNTER — Other Ambulatory Visit: Payer: Self-pay

## 2023-05-08 ENCOUNTER — Emergency Department (HOSPITAL_COMMUNITY)
Admission: EM | Admit: 2023-05-08 | Discharge: 2023-05-08 | Discharge status: Against Medical Advice | Payer: BLUE CROSS/BLUE SHIELD | Attending: Emergency Medicine | Admitting: Emergency Medicine

## 2023-05-08 DIAGNOSIS — Z5321 Procedure and treatment not carried out due to patient leaving prior to being seen by health care provider: Secondary | ICD-10-CM | POA: Insufficient documentation

## 2023-05-08 DIAGNOSIS — R0602 Shortness of breath: Secondary | ICD-10-CM | POA: Diagnosis not present

## 2023-05-08 DIAGNOSIS — R079 Chest pain, unspecified: Secondary | ICD-10-CM | POA: Insufficient documentation

## 2023-05-08 LAB — CBC
HCT: 40.7 % (ref 39.0–52.0)
Hemoglobin: 12.8 g/dL — ABNORMAL LOW (ref 13.0–17.0)
MCH: 26.6 pg (ref 26.0–34.0)
MCHC: 31.4 g/dL (ref 30.0–36.0)
MCV: 84.6 fL (ref 80.0–100.0)
Platelets: 184 10*3/uL (ref 150–400)
RBC: 4.81 MIL/uL (ref 4.22–5.81)
RDW: 14.6 % (ref 11.5–15.5)
WBC: 6.6 10*3/uL (ref 4.0–10.5)
nRBC: 0 % (ref 0.0–0.2)

## 2023-05-08 LAB — BASIC METABOLIC PANEL
Anion gap: 11 (ref 5–15)
BUN: 15 mg/dL (ref 6–20)
CO2: 22 mmol/L (ref 22–32)
Calcium: 9.3 mg/dL (ref 8.9–10.3)
Chloride: 109 mmol/L (ref 98–111)
Creatinine, Ser: 0.83 mg/dL (ref 0.61–1.24)
GFR, Estimated: >60 mL/min (ref >60)
Glucose, Bld: 109 mg/dL — ABNORMAL HIGH (ref 70–99)
Potassium: 3.8 mmol/L (ref 3.5–5.1)
Sodium: 142 mmol/L (ref 135–145)

## 2023-05-08 LAB — TROPONIN I (HIGH SENSITIVITY): Troponin I (High Sensitivity): 5 ng/L (ref <18)

## 2023-05-08 NOTE — ED Notes (Signed)
Pt transported to xray 

## 2023-05-08 NOTE — ED Provider Triage Note (Signed)
Emergency Medicine Provider Triage Evaluation Note  Jesse Mckinney , a 37 y.o. male  was evaluated in triage.  Pt complains of chest pain, SHOB.  Patient was at work tonight and sitting at his desk when he developed epigastric chest discomfort associated with shortness of breath around 1:45 AM.  Patient called his supervisor who called EMS.  Patient feels like he was improving and initially declined transport but came after EMS encouraged him to get checked out. Nothing makes pain worse. Denies hx of tobacco use, DM, HTN, HLD. States sched to see Cards Friday, follow up from heart murmur.  Fam hx sig for mom with afib.  Review of Systems  Positive:  Negative:   Physical Exam  BP 128/78 (BP Location: Left Arm)   Pulse (!) 59   Temp 97.9 F (36.6 C) (Oral)   Resp 18   Ht 6\' 6"  (1.981 m)   Wt (!) 222.3 kg   SpO2 97%   BMI 56.63 kg/m  Gen:   Awake, no distress   Resp:  Normal effort  MSK:   Moves extremities without difficulty  Other:  Abd soft and non tender, no chest wall tenderness   Medical Decision Making  Medically screening exam initiated at 4:10 AM.  Appropriate orders placed.  Corion Sherrod was informed that the remainder of the evaluation will be completed by another provider, this initial triage assessment does not replace that evaluation, and the importance of remaining in the ED until their evaluation is complete.     Jeannie Fend, PA-C 05/08/23 [phone]

## 2023-05-08 NOTE — ED Triage Notes (Signed)
Pt to ED via [facility] from work. Pt started chest pain intermittently. Pt took ASA 325mg  PTA. Pt states he also has shortness of breath. Denies nausea or vomiting.    EMS VS 138/88 HR 64 97% RA Cbg = 102

## 2023-07-29 ENCOUNTER — Other Ambulatory Visit: Payer: Self-pay

## 2023-07-29 ENCOUNTER — Emergency Department (HOSPITAL_COMMUNITY): Payer: BLUE CROSS/BLUE SHIELD

## 2023-07-29 ENCOUNTER — Emergency Department (HOSPITAL_COMMUNITY)
Admission: EM | Admit: 2023-07-29 | Discharge: 2023-07-29 | Discharge status: Against Medical Advice | Payer: BLUE CROSS/BLUE SHIELD | Attending: Emergency Medicine | Admitting: Emergency Medicine

## 2023-07-29 DIAGNOSIS — R519 Headache, unspecified: Secondary | ICD-10-CM | POA: Diagnosis present

## 2023-07-29 DIAGNOSIS — Z5321 Procedure and treatment not carried out due to patient leaving prior to being seen by health care provider: Secondary | ICD-10-CM | POA: Diagnosis not present

## 2023-07-29 DIAGNOSIS — R42 Dizziness and giddiness: Secondary | ICD-10-CM | POA: Insufficient documentation

## 2023-07-29 DIAGNOSIS — R079 Chest pain, unspecified: Secondary | ICD-10-CM | POA: Diagnosis not present

## 2023-07-29 LAB — CBC WITH DIFFERENTIAL/PLATELET
Abs Immature Granulocytes: 0.02 10*3/uL (ref 0.00–0.07)
Basophils Absolute: 0 10*3/uL (ref 0.0–0.1)
Basophils Relative: 0 %
Eosinophils Absolute: 0.1 10*3/uL (ref 0.0–0.5)
Eosinophils Relative: 2 %
HCT: 42.8 % (ref 39.0–52.0)
Hemoglobin: 13.3 g/dL (ref 13.0–17.0)
Immature Granulocytes: 0 %
Lymphocytes Relative: 23 %
Lymphs Abs: 1.6 10*3/uL (ref 0.7–4.0)
MCH: 26.2 pg (ref 26.0–34.0)
MCHC: 31.1 g/dL (ref 30.0–36.0)
MCV: 84.3 fL (ref 80.0–100.0)
Monocytes Absolute: 0.6 10*3/uL (ref 0.1–1.0)
Monocytes Relative: 8 %
Neutro Abs: 4.7 10*3/uL (ref 1.7–7.7)
Neutrophils Relative %: 67 %
Platelets: 202 10*3/uL (ref 150–400)
RBC: 5.08 MIL/uL (ref 4.22–5.81)
RDW: 14.4 % (ref 11.5–15.5)
WBC: 7 10*3/uL (ref 4.0–10.5)
nRBC: 0 % (ref 0.0–0.2)

## 2023-07-29 LAB — COMPREHENSIVE METABOLIC PANEL
ALT: 23 U/L (ref 0–44)
AST: 20 U/L (ref 15–41)
Albumin: 3.4 g/dL — ABNORMAL LOW (ref 3.5–5.0)
Alkaline Phosphatase: 71 U/L (ref 38–126)
Anion gap: 10 (ref 5–15)
BUN: 14 mg/dL (ref 6–20)
CO2: 23 mmol/L (ref 22–32)
Calcium: 9.1 mg/dL (ref 8.9–10.3)
Chloride: 107 mmol/L (ref 98–111)
Creatinine, Ser: 0.9 mg/dL (ref 0.61–1.24)
GFR, Estimated: >60 mL/min (ref >60)
Glucose, Bld: 113 mg/dL — ABNORMAL HIGH (ref 70–99)
Potassium: 4 mmol/L (ref 3.5–5.1)
Sodium: 140 mmol/L (ref 135–145)
Total Bilirubin: 1 mg/dL (ref 0.0–1.2)
Total Protein: 6.8 g/dL (ref 6.5–8.1)

## 2023-07-29 LAB — TROPONIN I (HIGH SENSITIVITY): Troponin I (High Sensitivity): 3 ng/L (ref <18)

## 2023-07-29 NOTE — ED Notes (Signed)
Pt called 3x, pt did not answer

## 2023-07-29 NOTE — ED Notes (Signed)
Pt not responding for vital update 2x

## 2023-07-29 NOTE — ED Provider Triage Note (Signed)
 Emergency Medicine Provider Triage Evaluation Note  Yamil Oelke , a 37 y.o. male  was evaluated in triage.  Pt complains of headache and chest pain.  Woke up with headache yesterday, stabbing on the right side. Tried apple cider vinegar without improvement.  Felt lightheaded going from sitting to standing. No numbness/weakness. No fever. No family hx of aneurysm or early heart disease. Had dyspnea earlier not now.  Review of Systems  Positive: Headache, cp, dyspnea Negative: Numbness, weakness, vision changes, speech changes, difficulty walking, facial droop  Physical Exam  BP 130/88   Pulse 69   Temp 97.9 F (36.6 C)   Resp 18   Ht 6' 5 (1.956 m)   Wt (!) 222.3 kg   SpO2 95%   BMI 58.11 kg/m  Gen:   Awake, no distress   Resp:  Normal effort  MSK:   Moves extremities without difficulty Other:  Normal bilateral upper ext pulses  Medical Decision Making  Medically screening exam initiated at 8:47 AM.  Appropriate orders placed.  Jyron Turman was informed that the remainder of the evaluation will be completed by another provider, this initial triage assessment does not replace that evaluation, and the importance of remaining in the ED until their evaluation is complete.  Placed orders to evaluate CP, CT head for headache, compazine/benadryl for headache.    Dreama Longs, MD 07/29/23 [phone]

## 2023-07-29 NOTE — ED Triage Notes (Signed)
 From home, had a bad headache yesterday took apple cider vinger, still has headache this am. Experience center of chest sharp, tight chest pain. No radiation, no provocation, some sob.  Hx anxiety.  12 lead EKG unremarkable according to ems.

## 2023-07-29 NOTE — ED Notes (Signed)
Pt called 3x for vitals, did not answer.

## 2023-12-06 ENCOUNTER — Other Ambulatory Visit: Payer: Self-pay

## 2023-12-06 ENCOUNTER — Encounter (HOSPITAL_BASED_OUTPATIENT_CLINIC_OR_DEPARTMENT_OTHER): Payer: Self-pay | Admitting: Emergency Medicine

## 2023-12-06 ENCOUNTER — Emergency Department (HOSPITAL_BASED_OUTPATIENT_CLINIC_OR_DEPARTMENT_OTHER)
Admission: EM | Admit: 2023-12-06 | Discharge: 2023-12-06 | Disposition: A | Discharge status: Home / Self Care | Attending: Emergency Medicine | Admitting: Emergency Medicine

## 2023-12-06 ENCOUNTER — Emergency Department (HOSPITAL_BASED_OUTPATIENT_CLINIC_OR_DEPARTMENT_OTHER)

## 2023-12-06 DIAGNOSIS — R002 Palpitations: Secondary | ICD-10-CM | POA: Insufficient documentation

## 2023-12-06 DIAGNOSIS — R1010 Upper abdominal pain, unspecified: Secondary | ICD-10-CM | POA: Diagnosis not present

## 2023-12-06 DIAGNOSIS — R0789 Other chest pain: Secondary | ICD-10-CM | POA: Insufficient documentation

## 2023-12-06 DIAGNOSIS — R079 Chest pain, unspecified: Secondary | ICD-10-CM

## 2023-12-06 LAB — COMPREHENSIVE METABOLIC PANEL WITH GFR
ALT: 11 U/L (ref 0–44)
AST: 13 U/L — ABNORMAL LOW (ref 15–41)
Albumin: 3.8 g/dL (ref 3.5–5.0)
Alkaline Phosphatase: 84 U/L (ref 38–126)
Anion gap: 9 (ref 5–15)
BUN: 15 mg/dL (ref 6–20)
CO2: 27 mmol/L (ref 22–32)
Calcium: 9.4 mg/dL (ref 8.9–10.3)
Chloride: 106 mmol/L (ref 98–111)
Creatinine, Ser: 0.99 mg/dL (ref 0.61–1.24)
GFR, Estimated: >60 mL/min (ref >60)
Glucose, Bld: 103 mg/dL — ABNORMAL HIGH (ref 70–99)
Potassium: 3.8 mmol/L (ref 3.5–5.1)
Sodium: 142 mmol/L (ref 135–145)
Total Bilirubin: 0.4 mg/dL (ref 0.0–1.2)
Total Protein: 7 g/dL (ref 6.5–8.1)

## 2023-12-06 LAB — CBC WITH DIFFERENTIAL/PLATELET
Abs Immature Granulocytes: 0.04 10*3/uL (ref 0.00–0.07)
Basophils Absolute: 0 10*3/uL (ref 0.0–0.1)
Basophils Relative: 0 %
Eosinophils Absolute: 0.1 10*3/uL (ref 0.0–0.5)
Eosinophils Relative: 1 %
HCT: 40.3 % (ref 39.0–52.0)
Hemoglobin: 12.5 g/dL — ABNORMAL LOW (ref 13.0–17.0)
Immature Granulocytes: 0 %
Lymphocytes Relative: 19 %
Lymphs Abs: 2 10*3/uL (ref 0.7–4.0)
MCH: 25.7 pg — ABNORMAL LOW (ref 26.0–34.0)
MCHC: 31 g/dL (ref 30.0–36.0)
MCV: 82.9 fL (ref 80.0–100.0)
Monocytes Absolute: 0.7 10*3/uL (ref 0.1–1.0)
Monocytes Relative: 7 %
Neutro Abs: 7.3 10*3/uL (ref 1.7–7.7)
Neutrophils Relative %: 73 %
Platelets: 219 10*3/uL (ref 150–400)
RBC: 4.86 MIL/uL (ref 4.22–5.81)
RDW: 14.6 % (ref 11.5–15.5)
WBC: 10.1 10*3/uL (ref 4.0–10.5)
nRBC: 0 % (ref 0.0–0.2)

## 2023-12-06 LAB — LIPASE, BLOOD: Lipase: 25 U/L (ref 11–51)

## 2023-12-06 LAB — TROPONIN T, HIGH SENSITIVITY: Troponin T High Sensitivity: <15 ng/L (ref <19)

## 2023-12-06 MED ORDER — ONDANSETRON 4 MG PO TBDP: 4.0000 mg | ORAL_TABLET | Freq: Three times a day (TID) | ORAL | 0 refills | Status: DC | PRN

## 2023-12-06 MED ORDER — MORPHINE SULFATE (PF) 4 MG/ML IV SOLN
4.0000 mg | Freq: Once | INTRAVENOUS | Status: AC
  Administered 2023-12-06: 4 mg via INTRAVENOUS
  Filled 2023-12-06: qty 1

## 2023-12-06 MED ORDER — HYOSCYAMINE SULFATE 0.125 MG SL SUBL
0.2500 mg | SUBLINGUAL_TABLET | Freq: Once | SUBLINGUAL | Status: AC
  Administered 2023-12-06: 0.25 mg via SUBLINGUAL
  Filled 2023-12-06: qty 2

## 2023-12-06 MED ORDER — ONDANSETRON HCL 4 MG/2ML IJ SOLN
4.0000 mg | Freq: Once | INTRAMUSCULAR | Status: AC
  Administered 2023-12-06: 4 mg via INTRAVENOUS
  Filled 2023-12-06: qty 2

## 2023-12-06 MED ORDER — PANTOPRAZOLE SODIUM 40 MG PO TBEC: 40.0000 mg | DELAYED_RELEASE_TABLET | Freq: Every day | ORAL | 1 refills | Status: DC

## 2023-12-06 MED ORDER — ALUM & MAG HYDROXIDE-SIMETH 200-200-20 MG/5ML PO SUSP
30.0000 mL | Freq: Once | ORAL | Status: AC
  Administered 2023-12-06: 30 mL via ORAL
  Filled 2023-12-06: qty 30

## 2023-12-06 NOTE — ED Triage Notes (Signed)
 Pt reports abd cramping and a change in bowel movements for weeks; today he started having CP and felt like his heart was racing; describes as full felling in epigastric area; [facility] with exertion

## 2023-12-06 NOTE — ED Provider Notes (Signed)
 [place] [facility] AT [facility] Provider Note   CSN: [id number] Arrival date & time: 12/06/23  1754     History  Chief Complaint  Patient presents with   Chest Pain    Jesse Mckinney is a 38 y.o. male.  Patient with past medical history of GERD, panic attack, palpitations is presenting to the emergency room with complaint of chest pain and abdominal pain.  Patient reports that for the last 4 weeks he has had 1 episode of loose stool per day.  Has also had associated bloating and abdominal discomfort over the past week.  He does feel symptoms are worse with eating.  He has not tried changing diet.  Noticed yesterday he had some burning in his chest and felt that he had regurgitation.  Now having chest pain that started this morning which is located through entire anterior chest and feels like pressure.  This is associated with palpitations.  Denies any diaphoresis, generalized weakness or shortness of breath.   Chest Pain Associated symptoms: abdominal pain        Home Medications Prior to Admission medications   Medication Sig Start Date End Date Taking? Authorizing Provider  albuterol  (VENTOLIN  HFA) 108 (90 Base) MCG/ACT inhaler Inhale 1-2 puffs into the lungs every 6 (six) hours as needed for wheezing or shortness of breath. 06/25/22   Albertus Alt, PA-C  ALPRAZolam (XANAX) 0.5 MG tablet Take 0.5 mg by mouth daily as needed. Patient not taking: Reported on 01/18/2022 12/08/21   [provider]  benzonatate  (TESSALON ) 100 MG capsule Take 1 capsule (100 mg total) by mouth 3 (three) times daily as needed for cough. 06/06/22   Mordecai Applebaum, MD  busPIRone  (BUSPAR ) 10 MG tablet Take 10 mg by mouth daily. 10/01/21   [provider]  CLOMID 50 MG tablet Take 25 mg by mouth daily. 12/08/21   [provider]  cyclobenzaprine  (FLEXERIL ) 5 MG tablet Take 1 tablet (5 mg total) by mouth 3 (three) times daily as needed for muscle  spasms. 04/03/23   Adolph Hoop, PA-C  diclofenac  (VOLTAREN ) 75 MG EC tablet Take 1 tablet (75 mg total) by mouth 2 (two) times daily. 05/05/23   Sofia, Leslie K, PA-C  dicyclomine  (BENTYL ) 20 MG tablet Take 1 tablet (20 mg total) by mouth 2 (two) times daily. 11/16/22   Ceclia Cohens, Amjad, PA-C  Fiber Adult Gummies 2 g CHEW Chew 1 Dose by mouth daily.    [provider]  guaiFENesin  (ROBITUSSIN) 100 MG/5ML liquid Take 5-10 mLs (100-200 mg total) by mouth every 4 (four) hours as needed for cough or to loosen phlegm. 06/25/22   Albertus Alt, PA-C  loratadine  (CLARITIN ) 10 MG tablet Take 1 tablet (10 mg total) by mouth daily. 06/25/22   Albertus Alt, PA-C  methylPREDNISolone  (MEDROL  DOSEPAK) 4 MG TBPK tablet Take as directed 06/25/22   Cockerham, Alicia M, PA-C  metoprolol  succinate (TOPROL  XL) 25 MG 24 hr tablet Take 1 tablet (25 mg total) by mouth at bedtime. 10/09/21 10/10/22  Swinyer, Leilani Punter, NP  Multiple Vitamin (ONE-A-DAY MENS PO) Take 1 Dose by mouth daily. One-A-Day Men's gummy vitamin    [provider]  naproxen  (NAPROSYN ) 500 MG tablet Take 1 tablet (500 mg total) by mouth 2 (two) times daily with a meal. 04/03/23   Adolph Hoop, PA-C  ondansetron  (ZOFRAN -ODT) 4 MG disintegrating tablet Take 1 tablet (4 mg total) by mouth every 8 (eight) hours as needed. 11/16/22  Ceclia Cohens, Amjad, PA-C  pantoprazole  (PROTONIX ) 20 MG tablet Take 20 mg by mouth daily as needed for heartburn. 09/27/21   [provider]      Allergies    Nitrostat  [nitroglycerin ], Iodinated contrast media, Iodine, and Mushroom extract complex (obsolete)    Review of Systems   Review of Systems  Cardiovascular:  Positive for chest pain.  Gastrointestinal:  Positive for abdominal pain.    Physical Exam Updated Vital Signs BP (!) 160/92 (BP Location: Right Wrist)   Pulse 84   Temp 99 F (37.2 C) (Oral)   Resp 18   Ht 6\' 6"  (1.981 m)   Wt (!) 220.9 kg   SpO2 96%   BMI 56.28 kg/m  Physical  Exam Vitals and nursing note reviewed.  Constitutional:      General: He is not in acute distress.    Appearance: He is obese. He is not toxic-appearing.  HENT:     Head: Normocephalic and atraumatic.  Eyes:     General: No scleral icterus.    Conjunctiva/sclera: Conjunctivae normal.  Cardiovascular:     Rate and Rhythm: Normal rate and regular rhythm.     Pulses: Normal pulses.     Heart sounds: Normal heart sounds.  Pulmonary:     Effort: Pulmonary effort is normal. No respiratory distress.     Breath sounds: Normal breath sounds.  Chest:     Chest wall: No tenderness.  Abdominal:     General: Abdomen is flat. Bowel sounds are normal. There is no distension.     Palpations: Abdomen is soft. There is no mass.     Tenderness: There is no abdominal tenderness. There is no guarding or rebound.     Hernia: No hernia is present.  Musculoskeletal:     Right lower leg: No edema.     Left lower leg: No edema.  Skin:    General: Skin is warm and dry.     Findings: No lesion.  Neurological:     General: No focal deficit present.     Mental Status: He is alert and oriented to person, place, and time. Mental status is at baseline.     ED Results / Procedures / Treatments   Labs (all labs ordered are listed, but only abnormal results are displayed) Labs Reviewed  COMPREHENSIVE METABOLIC PANEL WITH GFR - Abnormal; Notable for the following components:      Result Value   Glucose, Bld 103 (*)    AST 13 (*)    All other components within normal limits  CBC WITH DIFFERENTIAL/PLATELET - Abnormal; Notable for the following components:   Hemoglobin 12.5 (*)    MCH 25.7 (*)    All other components within normal limits  LIPASE, BLOOD  URINALYSIS, ROUTINE W REFLEX MICROSCOPIC  TROPONIN T, HIGH SENSITIVITY    EKG EKG Interpretation Date/Time:  Saturday Dec 06 2023 18:00:43 EDT Ventricular Rate:  89 PR Interval:  165 QRS Duration:  78 QT Interval:  350 QTC Calculation: 426 R  Axis:   53  Text Interpretation: Sinus rhythm Low voltage, precordial leads Borderline T abnormalities, diffuse leads Confirmed by Hiawatha Lout ([id number]) on 12/06/2023 7:17:51 PM  Radiology DG Chest Portable 1 View Result Date: 12/06/2023 CLINICAL DATA:  Chest pain. EXAM: PORTABLE CHEST 1 VIEW COMPARISON:  Radiograph 07/29/2023 FINDINGS: Upper normal heart size likely accentuated by portable AP technique. No focal airspace disease, large pleural effusion, pulmonary edema or pneumothorax. IMPRESSION: No acute chest findings. Electronically Signed  By: Chadwick Colonel M.D.   On: 12/06/2023 21:20    Procedures Procedures    Medications Ordered in ED Medications  morphine  (PF) 4 MG/ML injection 4 mg (has no administration in time range)  ondansetron  (ZOFRAN ) injection 4 mg (has no administration in time range)    ED Course/ Medical Decision Making/ A&P                                 Medical Decision Making Amount and/or Complexity of Data Reviewed Labs: ordered. Radiology: ordered.  Risk OTC drugs. Prescription drug management.   This patient presents to the ED for concern of Cp/upper abd pain, this involves an extensive number of treatment options, and is a complaint that carries with it a high risk of complications and morbidity.  The differential diagnosis includes pericarditis, CHF exacerbation, COPD, pulmonary embolism, ACS, pneumothorax, pneumonia, cholecystitis, appendicitis, small bowel obstruction, pancreatitis, hepatitis   Co morbidities that complicate the patient evaluation  GERD, Obesity     Lab Tests:  I personally interpreted labs.  The pertinent results include:   Cbc without leukocytosis, no anemia Cmp w/ normal liver and kidney function Lipase 25 Trop <15   Imaging Studies ordered:  I ordered imaging studies including chest x-ray  I independently visualized and interpreted imaging which showed negative.  I agree with the radiologist  interpretation   Cardiac Monitoring: / EKG:  The patient was maintained on a cardiac monitor.  I personally viewed and interpreted the cardiac monitored which showed an underlying rhythm of: sinus    Problem List / ED Course / Critical interventions / Medication management  Patient coming into emergency room with upper abdominal pain and chest pain.  He is stable and well-appearing.  He has reassuring EKG and troponin negative thus doubt ACS.  Symptoms are not consistent with aortic dissection. He is PERC negative doubt PE.  Chest x-ray does not show pneumonia or pneumothorax.  Labs are overall reassuring.  He is feeling better after receiving 1 dose of morphine  and Zofran .  Will give Maalox. No focal area of abdominal tenderness, abdominal is soft and non distended. Do not feel imaging is needed at this time. Feels most likely consistent with a gastritis.  Will send home with Protonix  and follow-up with GI.  I ordered medication including morphine , zofran  for pain control  Reevaluation of the patient after these medicines showed that the patient improved I have reviewed the patients home medicines and have made adjustments as needed   Plan  F/u w/ PCP in 2-3d to ensure resolution of sx.  Patient was given return precautions. Patient stable for discharge at this time.  Patient educated on sx/dx and verbalized understanding of plan. Return to ER w/ new or worsening sx.          Final Clinical Impression(s) / ED Diagnoses Final diagnoses:  Chest pain, unspecified type    Rx / DC Orders ED Discharge Orders          Ordered    pantoprazole  (PROTONIX ) 40 MG tablet  Daily        12/06/23 2040    ondansetron  (ZOFRAN -ODT) 4 MG disintegrating tablet  Every 8 hours PRN        12/06/23 2040              Mc Hollen, Kandace Organ, PA-C 12/06/23 2128    Mordecai Applebaum, MD 12/07/23 1253

## 2023-12-06 NOTE — Discharge Instructions (Addendum)
 Sent Protonix  to pharmacy.  Please take as prescribed.  I have sent Zofran  which she can take as needed for nausea and vomiting.  Please follow-up with GI doctor and return to ER with new or worsening symptoms.

## 2024-02-06 ENCOUNTER — Other Ambulatory Visit: Payer: Self-pay

## 2024-02-06 ENCOUNTER — Emergency Department (HOSPITAL_COMMUNITY): Payer: Self-pay

## 2024-02-06 ENCOUNTER — Emergency Department (HOSPITAL_COMMUNITY)
Admission: EM | Admit: 2024-02-06 | Discharge: 2024-02-06 | Disposition: A | Discharge status: Home / Self Care | Payer: Self-pay | Attending: Emergency Medicine | Admitting: Emergency Medicine

## 2024-02-06 ENCOUNTER — Encounter (HOSPITAL_COMMUNITY): Payer: Self-pay | Admitting: *Deleted

## 2024-02-06 DIAGNOSIS — I10 Essential (primary) hypertension: Secondary | ICD-10-CM | POA: Insufficient documentation

## 2024-02-06 DIAGNOSIS — R0789 Other chest pain: Secondary | ICD-10-CM | POA: Insufficient documentation

## 2024-02-06 DIAGNOSIS — Z79899 Other long term (current) drug therapy: Secondary | ICD-10-CM | POA: Insufficient documentation

## 2024-02-06 LAB — BASIC METABOLIC PANEL WITH GFR
Anion gap: 9 (ref 5–15)
BUN: 13 mg/dL (ref 6–20)
CO2: 20 mmol/L — ABNORMAL LOW (ref 22–32)
Calcium: 9 mg/dL (ref 8.9–10.3)
Chloride: 108 mmol/L (ref 98–111)
Creatinine, Ser: 0.83 mg/dL (ref 0.61–1.24)
GFR, Estimated: >60 mL/min (ref >60)
Glucose, Bld: 126 mg/dL — ABNORMAL HIGH (ref 70–99)
Potassium: 4.1 mmol/L (ref 3.5–5.1)
Sodium: 137 mmol/L (ref 135–145)

## 2024-02-06 LAB — CBC
HCT: 42.7 % (ref 39.0–52.0)
Hemoglobin: 13.2 g/dL (ref 13.0–17.0)
MCH: 25.8 pg — ABNORMAL LOW (ref 26.0–34.0)
MCHC: 30.9 g/dL (ref 30.0–36.0)
MCV: 83.6 fL (ref 80.0–100.0)
Platelets: 224 K/uL (ref 150–400)
RBC: 5.11 MIL/uL (ref 4.22–5.81)
RDW: 14.6 % (ref 11.5–15.5)
WBC: 8.1 K/uL (ref 4.0–10.5)
nRBC: 0 % (ref 0.0–0.2)

## 2024-02-06 LAB — TROPONIN I (HIGH SENSITIVITY): Troponin I (High Sensitivity): 4 ng/L (ref <18)

## 2024-02-06 MED ORDER — SUCRALFATE 1 G PO TABS
1.0000 g | ORAL_TABLET | Freq: Once | ORAL | Status: AC
  Administered 2024-02-06: 1 g via ORAL
  Filled 2024-02-06: qty 1

## 2024-02-06 MED ORDER — METOPROLOL SUCCINATE ER 25 MG PO TB24: 25.0000 mg | ORAL_TABLET | Freq: Every day | ORAL | 0 refills | Status: DC

## 2024-02-06 MED ORDER — SUCRALFATE 1 G PO TABS: 1.0000 g | ORAL_TABLET | Freq: Three times a day (TID) | ORAL | 0 refills | Status: DC

## 2024-02-06 NOTE — ED Provider Notes (Signed)
 [place] EMERGENCY DEPARTMENT AT [facility] Provider Note   CSN: [id number] Arrival date & time: 02/06/24  1448     Patient presents with: Chest Pain   Jesse Mckinney is a 38 y.o. male.   HPI Patient presents with chest pain.  Pain began a few days ago, since that time has been waxing, waning, present, not present and currently he has no pain at all. About a month ago patient also began having loose stool.  He notes that he began a new job prior to that.  Prior to leaving the job he was generally well, denies history of medical problems, beyond hypertension.  He also has heartburn, takes Nexium daily.     Prior to Admission medications   Medication Sig Start Date End Date Taking? Authorizing Provider  sucralfate  (CARAFATE ) 1 g tablet Take 1 tablet (1 g total) by mouth 4 (four) times daily -  with meals and at bedtime. 02/06/24  Yes Garrick Charleston, MD  albuterol  (VENTOLIN  HFA) 108 (90 Base) MCG/ACT inhaler Inhale 1-2 puffs into the lungs every 6 (six) hours as needed for wheezing or shortness of breath. 06/25/22   Renae Bernarda HERO, PA-C  ALPRAZolam (XANAX) 0.5 MG tablet Take 0.5 mg by mouth daily as needed. Patient not taking: Reported on 01/18/2022 12/08/21   [provider]  benzonatate  (TESSALON ) 100 MG capsule Take 1 capsule (100 mg total) by mouth 3 (three) times daily as needed for cough. 06/06/22   Francesca Elsie CROME, MD  busPIRone  (BUSPAR ) 10 MG tablet Take 10 mg by mouth daily. 10/01/21   [provider]  CLOMID 50 MG tablet Take 25 mg by mouth daily. 12/08/21   [provider]  cyclobenzaprine  (FLEXERIL ) 5 MG tablet Take 1 tablet (5 mg total) by mouth 3 (three) times daily as needed for muscle spasms. 04/03/23   Christopher Savannah, PA-C  diclofenac  (VOLTAREN ) 75 MG EC tablet Take 1 tablet (75 mg total) by mouth 2 (two) times daily. 05/05/23   Flint Sonny POUR, PA-C  dicyclomine  (BENTYL ) 20 MG tablet Take 1 tablet (20 mg total) by mouth 2 (two)  times daily. 11/16/22   Hildegard, Amjad, PA-C  Fiber Adult Gummies 2 g CHEW Chew 1 Dose by mouth daily.    [provider]  guaiFENesin  (ROBITUSSIN) 100 MG/5ML liquid Take 5-10 mLs (100-200 mg total) by mouth every 4 (four) hours as needed for cough or to loosen phlegm. 06/25/22   Renae Bernarda HERO, PA-C  loratadine  (CLARITIN ) 10 MG tablet Take 1 tablet (10 mg total) by mouth daily. 06/25/22   Renae Bernarda HERO, PA-C  methylPREDNISolone  (MEDROL  DOSEPAK) 4 MG TBPK tablet Take as directed 06/25/22   Cockerham, Alicia M, PA-C  metoprolol  succinate (TOPROL  XL) 25 MG 24 hr tablet Take 1 tablet (25 mg total) by mouth at bedtime. 02/06/24 03/07/24  Garrick Charleston, MD  Multiple Vitamin (ONE-A-DAY MENS PO) Take 1 Dose by mouth daily. One-A-Day Men's gummy vitamin    [provider]  naproxen  (NAPROSYN ) 500 MG tablet Take 1 tablet (500 mg total) by mouth 2 (two) times daily with a meal. 04/03/23   Christopher Savannah, PA-C  ondansetron  (ZOFRAN -ODT) 4 MG disintegrating tablet Take 1 tablet (4 mg total) by mouth every 8 (eight) hours as needed for nausea or vomiting. 12/06/23   Barrett, Warren SAILOR, PA-C  pantoprazole  (PROTONIX ) 40 MG tablet Take 1 tablet (40 mg total) by mouth daily. 12/06/23   Barrett, Warren SAILOR, PA-C    Allergies: Nitrostat  [nitroglycerin ],  Iodinated contrast media, Iodine, and Mushroom extract complex (obsolete)    Review of Systems  Updated Vital Signs BP 117/65 (BP Location: Left Arm)   Pulse 86   Temp 98.6 F (37 C) (Oral)   Resp 20   Ht 6' 6 (1.981 m)   Wt (!) 227.7 kg   SpO2 100%   BMI 58.01 kg/m   Physical Exam Vitals and nursing note reviewed.  Constitutional:      General: He is not in acute distress.    Appearance: He is well-developed. He is obese.  HENT:     Head: Normocephalic and atraumatic.  Eyes:     Conjunctiva/sclera: Conjunctivae normal.  Cardiovascular:     Rate and Rhythm: Normal rate and regular rhythm.  Pulmonary:     Effort: Pulmonary effort is  normal. No respiratory distress.     Breath sounds: No stridor.  Abdominal:     General: There is no distension.     Palpations: Abdomen is soft. There is no mass.     Tenderness: There is no abdominal tenderness. There is no guarding or rebound.  Skin:    General: Skin is warm and dry.  Neurological:     Mental Status: He is alert and oriented to person, place, and time.     (all labs ordered are listed, but only abnormal results are displayed) Labs Reviewed  BASIC METABOLIC PANEL WITH GFR - Abnormal; Notable for the following components:      Result Value   CO2 20 (*)    Glucose, Bld 126 (*)    All other components within normal limits  CBC - Abnormal; Notable for the following components:   MCH 25.8 (*)    All other components within normal limits  MAGNESIUM  TROPONIN I (HIGH SENSITIVITY)  TROPONIN I (HIGH SENSITIVITY)    EKG: None  Radiology: DG Chest 2 View Result Date: 02/06/2024 CLINICAL DATA:  chest pain EXAM: CHEST - 2 VIEW COMPARISON:  None available. FINDINGS: No focal airspace consolidation, pleural effusion, or pneumothorax. No cardiomegaly.No acute fracture or destructive lesion. IMPRESSION: No acute cardiopulmonary abnormality. Electronically Signed   By: Rogelia Myers M.D.   On: 02/06/2024 16:26     Procedures   Medications Ordered in the ED  sucralfate  (CARAFATE ) tablet 1 g (1 g Oral Given 02/06/24 1936)                                    Medical Decision Making Patient with a history of hypertension, heartburn presents with chest pain, episodic. Patient is awake, alert, currently has no pain.  Given the waxing, waning nature of the pain, some suspicion for gastroesophageal disease, and in addition, patient is relatively young though he does have hypertension and is obese, his risk profile for ACS is low. Given the patient description of new change in bowel movements and chest pain, both after starting new job, some suspicion for acute stress  reaction as well. Patient had labs Carafate  monitoring.   Amount and/or Complexity of Data Reviewed External Data Reviewed: notes. Labs: ordered. Decision-making details documented in ED Course. Radiology: ordered and independent interpretation performed. Decision-making details documented in ED Course. ECG/medicine tests: ordered and independent interpretation performed. Decision-making details documented in ED Course.  Risk Prescription drug management. Decision regarding hospitalization. Diagnosis or treatment significantly limited by social determinants of health.   9:51 PM Patient in no distress, no complaints.  We discussed all findings, patient appropriate for discharge with outpatient follow-up, no evidence for ACS, pneumonia, PE.  Patient will start Carafate  in addition to his Nexium.  He requests refill of his metoprolol  as well.  This was accommodated.     Final diagnoses:  Atypical chest pain    ED Discharge Orders          Ordered    sucralfate  (CARAFATE ) 1 g tablet  3 times daily with meals & bedtime       Note to Pharmacy: Take for one week   02/06/24 2151    metoprolol  succinate (TOPROL  XL) 25 MG 24 hr tablet  Daily at bedtime        02/06/24 2151               Garrick Charleston, MD 02/06/24 2151

## 2024-02-06 NOTE — ED Triage Notes (Signed)
 Patient presents to ED via [facility] states he started having chest pain approx. 1 hours ago non radiating no sob states he has a history of anxiety , states his anxiety medication was stopped 1 month ago , due to money. Patient was given 324 mg asa

## 2024-02-06 NOTE — ED Provider Triage Note (Signed)
 Emergency Medicine Provider Triage Evaluation Note  Jesse Mckinney , a 38 y.o. male  was evaluated in triage.  Pt complains of chest pain, palpitations.  Symptoms started today.  Denies history of heart disease..  Review of Systems  Positive: As above Negative: As above  Physical Exam  BP (!) 143/93 (BP Location: Left Wrist)   Pulse 95   Temp 99.8 F (37.7 C) (Oral)   Resp (!) 22   Ht 6' 6 (1.981 m)   Wt (!) 227.7 kg   SpO2 96%   BMI 58.01 kg/m  Gen:   Awake, no distress   Resp:  Normal effort  MSK:   Moves extremities without difficulty  Other:    Medical Decision Making  Medically screening exam initiated at 3:57 PM.  Appropriate orders placed.  Jesse Mckinney was informed that the remainder of the evaluation will be completed by another provider, this initial triage assessment does not replace that evaluation, and the importance of remaining in the ED until their evaluation is complete.    Jesse Loge, PA-C 02/06/24 1557

## 2024-02-06 NOTE — Discharge Instructions (Signed)
 Today's evaluation has been reassuring.  There is some evidence that your pain is due to irritation of your stomach and esophagus.  Please take your medication regimen as prescribed and follow-up with both your primary care physician and our gastroenterology colleagues.  Return here for concerning changes in your condition.

## 2024-02-29 ENCOUNTER — Emergency Department (HOSPITAL_BASED_OUTPATIENT_CLINIC_OR_DEPARTMENT_OTHER)
Admission: EM | Admit: 2024-02-29 | Discharge: 2024-03-01 | Disposition: A | Discharge status: Home / Self Care | Payer: Self-pay | Attending: Emergency Medicine | Admitting: Emergency Medicine

## 2024-02-29 ENCOUNTER — Other Ambulatory Visit: Payer: Self-pay

## 2024-02-29 ENCOUNTER — Encounter (HOSPITAL_BASED_OUTPATIENT_CLINIC_OR_DEPARTMENT_OTHER): Payer: Self-pay | Admitting: Emergency Medicine

## 2024-02-29 ENCOUNTER — Emergency Department (HOSPITAL_BASED_OUTPATIENT_CLINIC_OR_DEPARTMENT_OTHER): Payer: Self-pay

## 2024-02-29 DIAGNOSIS — K921 Melena: Secondary | ICD-10-CM | POA: Insufficient documentation

## 2024-02-29 DIAGNOSIS — R002 Palpitations: Secondary | ICD-10-CM | POA: Insufficient documentation

## 2024-02-29 LAB — COMPREHENSIVE METABOLIC PANEL WITH GFR
ALT: 12 U/L (ref 0–44)
AST: 15 U/L (ref 15–41)
Albumin: 3.9 g/dL (ref 3.5–5.0)
Alkaline Phosphatase: 83 U/L (ref 38–126)
Anion gap: 12 (ref 5–15)
BUN: 12 mg/dL (ref 6–20)
CO2: 24 mmol/L (ref 22–32)
Calcium: 9.4 mg/dL (ref 8.9–10.3)
Chloride: 107 mmol/L (ref 98–111)
Creatinine, Ser: 0.97 mg/dL (ref 0.61–1.24)
GFR, Estimated: >60 mL/min (ref >60)
Glucose, Bld: 98 mg/dL (ref 70–99)
Potassium: 3.9 mmol/L (ref 3.5–5.1)
Sodium: 142 mmol/L (ref 135–145)
Total Bilirubin: 0.5 mg/dL (ref 0.0–1.2)
Total Protein: 7.2 g/dL (ref 6.5–8.1)

## 2024-02-29 LAB — CBC WITH DIFFERENTIAL/PLATELET
Abs Immature Granulocytes: 0.01 K/uL (ref 0.00–0.07)
Basophils Absolute: 0 K/uL (ref 0.0–0.1)
Basophils Relative: 0 %
Eosinophils Absolute: 0.1 K/uL (ref 0.0–0.5)
Eosinophils Relative: 1 %
HCT: 40.9 % (ref 39.0–52.0)
Hemoglobin: 13 g/dL (ref 13.0–17.0)
Immature Granulocytes: 0 %
Lymphocytes Relative: 25 %
Lymphs Abs: 1.9 K/uL (ref 0.7–4.0)
MCH: 26.5 pg (ref 26.0–34.0)
MCHC: 31.8 g/dL (ref 30.0–36.0)
MCV: 83.5 fL (ref 80.0–100.0)
Monocytes Absolute: 0.6 K/uL (ref 0.1–1.0)
Monocytes Relative: 8 %
Neutro Abs: 5.2 K/uL (ref 1.7–7.7)
Neutrophils Relative %: 66 %
Platelets: 218 K/uL (ref 150–400)
RBC: 4.9 MIL/uL (ref 4.22–5.81)
RDW: 14.6 % (ref 11.5–15.5)
WBC: 7.9 K/uL (ref 4.0–10.5)
nRBC: 0 % (ref 0.0–0.2)

## 2024-02-29 LAB — URINALYSIS, ROUTINE W REFLEX MICROSCOPIC
Bacteria, UA: NONE SEEN
Bilirubin Urine: NEGATIVE
Glucose, UA: NEGATIVE mg/dL
Hgb urine dipstick: NEGATIVE
Ketones, ur: NEGATIVE mg/dL
Leukocytes,Ua: NEGATIVE
Nitrite: NEGATIVE
Protein, ur: NEGATIVE mg/dL
Specific Gravity, Urine: 1.023 (ref 1.005–1.030)
pH: 5.5 (ref 5.0–8.0)

## 2024-02-29 LAB — TROPONIN T, HIGH SENSITIVITY: Troponin T High Sensitivity: <15 ng/L (ref <19)

## 2024-02-29 LAB — LIPASE, BLOOD: Lipase: 24 U/L (ref 11–51)

## 2024-02-29 LAB — MAGNESIUM: Magnesium: 2 mg/dL (ref 1.7–2.4)

## 2024-02-29 NOTE — ED Provider Notes (Signed)
 [place] EMERGENCY DEPARTMENT AT [facility] Provider Note   CSN: [id number] Arrival date & time: 02/29/24  1950     Patient presents with: Palpitations   Jesse Mckinney is a 38 y.o. male.   HPI Patient reports that he had an episode of racing heart earlier today.  He reports that when it happened he felt shortness of breath and lightheadedness.  Patient reports that he did call EMS and when they came to evaluate the symptoms were improving.  By the time they did their evaluation he did not feel he needed to be transported by EMS.  Patient has had similar episodes in the past.  He has worn a heart monitor and had several heart tests done a few years ago.  Patient reports the other thing that got him concerned was that he had a bowel movement and he saw blood in the toilet water after the bowel movement.  He reports that he has some history of hemorrhoids when he was younger but has not had any ongoing bleeding.  He is not having any abdominal or rectal pain.  No lightheadedness or dizziness except in the context of when his heart was racing earlier today.  Patient reports when that was happening he could tell he was getting a lot of the PVCs.    Prior to Admission medications   Medication Sig Start Date End Date Taking? Authorizing Provider  albuterol  (VENTOLIN  HFA) 108 (90 Base) MCG/ACT inhaler Inhale 1-2 puffs into the lungs every 6 (six) hours as needed for wheezing or shortness of breath. 06/25/22   Renae Bernarda HERO, PA-C  ALPRAZolam (XANAX) 0.5 MG tablet Take 0.5 mg by mouth daily as needed. Patient not taking: Reported on 01/18/2022 12/08/21   [provider]  benzonatate  (TESSALON ) 100 MG capsule Take 1 capsule (100 mg total) by mouth 3 (three) times daily as needed for cough. 06/06/22   Francesca Elsie CROME, MD  busPIRone  (BUSPAR ) 10 MG tablet Take 10 mg by mouth daily. 10/01/21   [provider]  CLOMID 50 MG tablet Take 25 mg by mouth daily. 12/08/21    [provider]  cyclobenzaprine  (FLEXERIL ) 5 MG tablet Take 1 tablet (5 mg total) by mouth 3 (three) times daily as needed for muscle spasms. 04/03/23   Christopher Savannah, PA-C  diclofenac  (VOLTAREN ) 75 MG EC tablet Take 1 tablet (75 mg total) by mouth 2 (two) times daily. 05/05/23   Flint Sonny POUR, PA-C  dicyclomine  (BENTYL ) 20 MG tablet Take 1 tablet (20 mg total) by mouth 2 (two) times daily. 11/16/22   Hildegard, Amjad, PA-C  Fiber Adult Gummies 2 g CHEW Chew 1 Dose by mouth daily.    [provider]  guaiFENesin  (ROBITUSSIN) 100 MG/5ML liquid Take 5-10 mLs (100-200 mg total) by mouth every 4 (four) hours as needed for cough or to loosen phlegm. 06/25/22   Renae Bernarda HERO, PA-C  loratadine  (CLARITIN ) 10 MG tablet Take 1 tablet (10 mg total) by mouth daily. 06/25/22   Renae Bernarda HERO, PA-C  methylPREDNISolone  (MEDROL  DOSEPAK) 4 MG TBPK tablet Take as directed 06/25/22   Cockerham, Alicia M, PA-C  metoprolol  succinate (TOPROL  XL) 25 MG 24 hr tablet Take 1 tablet (25 mg total) by mouth at bedtime. 02/06/24 03/07/24  Garrick Charleston, MD  Multiple Vitamin (ONE-A-DAY MENS PO) Take 1 Dose by mouth daily. One-A-Day Men's gummy vitamin    [provider]  naproxen  (NAPROSYN ) 500 MG tablet Take 1 tablet (500 mg total) by mouth  2 (two) times daily with a meal. 04/03/23   Christopher Savannah, PA-C  ondansetron  (ZOFRAN -ODT) 4 MG disintegrating tablet Take 1 tablet (4 mg total) by mouth every 8 (eight) hours as needed for nausea or vomiting. 12/06/23   Barrett, Warren SAILOR, PA-C  pantoprazole  (PROTONIX ) 40 MG tablet Take 1 tablet (40 mg total) by mouth daily. 12/06/23   Barrett, Jamie N, PA-C  sucralfate  (CARAFATE ) 1 g tablet Take 1 tablet (1 g total) by mouth 4 (four) times daily -  with meals and at bedtime. 02/06/24   Garrick Charleston, MD    Allergies: Nitrostat  [nitroglycerin ], Iodinated contrast media, Iodine, and Mushroom extract complex (obsolete)    Review of Systems  Updated Vital Signs BP  116/71 (BP Location: Right Wrist)   Pulse 65   Temp 98.9 F (37.2 C) (Oral)   Resp 19   SpO2 99%   Physical Exam Constitutional:      Comments: Alert nontoxic no distress no respiratory distress.  HENT:     Mouth/Throat:     Pharynx: Oropharynx is clear.  Eyes:     Extraocular Movements: Extraocular movements intact.     Pupils: Pupils are equal, round, and reactive to light.  Cardiovascular:     Rate and Rhythm: Normal rate and regular rhythm.     Heart sounds: Normal heart sounds.  Pulmonary:     Effort: Pulmonary effort is normal.     Breath sounds: Normal breath sounds.  Abdominal:     General: There is no distension.     Palpations: Abdomen is soft.     Tenderness: There is no abdominal tenderness. There is no guarding.  Musculoskeletal:        General: No swelling or tenderness. Normal range of motion.     Cervical back: Neck supple.     Right lower leg: No edema.     Left lower leg: No edema.     Comments: Calf soft and nontender no peripheral edema  Skin:    General: Skin is warm and dry.  Neurological:     General: No focal deficit present.     Mental Status: He is oriented to person, place, and time.     Motor: No weakness.     Coordination: Coordination normal.  Psychiatric:        Mood and Affect: Mood normal.     (all labs ordered are listed, but only abnormal results are displayed) Labs Reviewed  COMPREHENSIVE METABOLIC PANEL WITH GFR  LIPASE, BLOOD  CBC WITH DIFFERENTIAL/PLATELET  URINALYSIS, ROUTINE W REFLEX MICROSCOPIC  MAGNESIUM  OCCULT BLOOD X 1 CARD TO LAB, STOOL  TROPONIN T, HIGH SENSITIVITY  TROPONIN T, HIGH SENSITIVITY    EKG: EKG Interpretation Date/Time:  Sunday February 29 2024 19:59:27 EDT Ventricular Rate:  74 PR Interval:  165 QRS Duration:  88 QT Interval:  391 QTC Calculation: 434 R Axis:   60  Text Interpretation: Sinus rhythm Low voltage, precordial leads normal, no sig change from previous accounting for inferior motion  artifact inferiorly on previous tracing Confirmed by Armenta Canning [phone]) on 02/29/2024 10:18:27 PM  Radiology: DG Chest 1 View Result Date: 02/29/2024 CLINICAL DATA:  Worsening palpitations and chest pain with shortness of breath. EXAM: CHEST  1 VIEW COMPARISON:  February 06, 2024 FINDINGS: The heart size and mediastinal contours are within normal limits. Both lungs are clear. The visualized skeletal structures are unremarkable. IMPRESSION: No active disease. Electronically Signed   By: Suzen Dials M.D.   On:  02/29/2024 22:40     Procedures   Medications Ordered in the ED - No data to display                                  Medical Decision Making Amount and/or Complexity of Data Reviewed Labs: ordered. Radiology: ordered.   Patient presents as outlined.  He had episode today of feeling that he was getting palpitations.  He reports they seem to get more frequent and then he developed shortness of breath and felt generally bad.  He did call EMS and at that time symptoms then started to abate.  Patient does have prior history of palpitations and chest pain.  He had workup in March 2023.  At that time patient did have echocardiogram that was normal and CT coronary artery scan with coronary artery calcium score of 0.  Patient also wore a ambulatory monitor December 2022 and had PVCs and PACs but no concerning dysrhythmia.  Magnesium 2.0 troponin less than 15 lipase 24 metabolic panel normal with normal LFTs urinalysis negative CBC normal with normal differential.  EKG interpreted by myself no ischemic appearance.  Normal sinus rhythm.  I have observed the patient's monitor multiple occasions.  He has had normal sinus rhythm with rare PVC.  At this time patient is clinically well in appearance.  Heart rates have been in the 60s to 70s.  Blood pressures are normal.  Patient did have cardiac evaluation in 2023 that showed coronary artery calcium score of 0.  Low risk at this time for  coronary artery disease or ischemia.  Troponin normal.  Patient did have monitoring that showed PVCs and PACs.  Patient has had rare PVCs on the monitor.  He describes being symptomatic with them earlier today.  At that time I suspect patient was experiencing increased anxiety and heart rate with more symptomatic PVCs.  At this time I have low suspicion for dysrhythmia.  I have advised the patient to follow-up with his cardiologist or PCP to discuss ambulatory monitoring again.  He voices understanding.  Patient other complaint of seeing blood in the toilet after bowel movement.  At this time, patient's blood counts are normal and no signs of anemia.  Patient is not hypotensive or tachycardic.  He does report a prior history of hemorrhoids.  At this time his most likely hemorrhoidal bleeding however patient is highly counseled to follow-up with PCP or GI for further evaluation and potentially endoscopy as there is possibility of more serious etiology such as colon cancer or other sources of bleeding.     Final diagnoses:  Palpitation  Blood present in stool    ED Discharge Orders     None          Armenta Canning, MD 03/01/24 0001

## 2024-02-29 NOTE — Discharge Instructions (Signed)
 1.  Review information for palpitations.  Avoid any caffeine containing beverages.  Avoid any over-the-counter medications that have stimulants in them such as many cold medications. 2.  You saw some blood in your stool today.  At this time your blood counts are normal.  Is very important that you do follow-up with your family doctor and get referred for further evaluation by gastroenterologist.  Often this bleeding is from hemorrhoids but sometimes people have more serious problems like colon or rectal cancer and need a colonoscopy.  Do not fail to get follow-up for this.

## 2024-02-29 NOTE — ED Triage Notes (Signed)
 Palpitation chest pain x  couple of months Worse today Sob  Recent weight gain  Some blood in toilet today

## 2024-07-06 ENCOUNTER — Ambulatory Visit

## 2024-07-06 ENCOUNTER — Telehealth: Payer: Self-pay | Admitting: Nurse Practitioner

## 2024-07-06 VITALS — BP 138/92 | HR 93 | Ht 78.0 in | Wt >= 6400 oz

## 2024-07-06 DIAGNOSIS — R002 Palpitations: Secondary | ICD-10-CM

## 2024-07-06 DIAGNOSIS — R0600 Dyspnea, unspecified: Secondary | ICD-10-CM

## 2024-07-06 DIAGNOSIS — E66813 Obesity, class 3: Secondary | ICD-10-CM

## 2024-07-06 MED ORDER — METOPROLOL SUCCINATE ER 50 MG PO TB24: 50.0000 mg | ORAL_TABLET | Freq: Every day | ORAL | 0 refills | Status: AC

## 2024-07-06 NOTE — Progress Notes (Signed)
 Cardiology Office Note Date:  07/06/2024  ID:  Jesse Mckinney, DOB [birth year], MRN [id number] PCP:  Pcp, No  Cardiologist:   Joelle VEAR Ren Donley, MD  Chief Complaint  Patient presents with   Palpitations      Problems Morbid obesity Anxiety Palpitations CTCA 0 3/23 TTE 6/23: 60-65%, mild LVH, mild moderate TR  Visits  [facility] 11/25: increase metop XL to 50 mg daily; ziopatch pending 12/25:  TTE, 14 day ziopatch, increase XL to 50    History of Present Illness: Discussed the use of AI scribe software for clinical note transcription with the patient, who gave verbal consent to proceed.  Jesse Mckinney is a 38 year old male who presents with palpitations and shortness of breath.He has had palpitations for three months. They occur with activity every few beats and at rest about every five minutes. He describes a pause followed by a brief rapid rhythm that then returns to a fast but steady rate.Shortness of breath began one week ago and is separate from the palpitations. It can occur at rest, including while sitting at his desk, and improves when he sits or lies down. He initially thought this was due to his panic disorder. He has gained significant weight from 430 to over 530 pounds. He has anxiety and panic disorder that he manages on his own. He does not smoke. His mother has atrial fibrillation. His wife notes loud snoring and he has a sleep study scheduled for January. He works from home at [employer] job.     ROS: Please see the history of present illness. All other systems are reviewed and negative.   Past Medical History:  Diagnosis Date   Anxiety    Chest pain    GERD (gastroesophageal reflux disease)    Palpitations    Panic attack     Past Surgical History:  Procedure Laterality Date   KNEE SURGERY      Current Outpatient Medications  Medication Sig Dispense Refill   busPIRone  (BUSPAR ) 10 MG tablet Take 10 mg by mouth daily.     metoprolol  succinate  (TOPROL -XL) 50 MG 24 hr tablet Take 1 tablet (50 mg total) by mouth at bedtime. 30 tablet 0   No current facility-administered medications for this visit.    Allergies:   Nitrostat  [nitroglycerin ], Iodinated contrast media, Iodine, and Mushroom extract complex (obsolete)   Social History:  see above  Family History:  see above  PHYSICAL EXAM: VS:  BP (!) 138/92   Pulse 93   Ht 6' 6 (1.981 m)   Wt (!) 547 lb 11.2 oz (248.4 kg)   SpO2 93%   BMI 63.29 kg/m  , BMI Body mass index is 63.29 kg/m. GEN: Well nourished, well developed, in no acute distress HEENT: normal Neck: no JVD, carotid bruits, or masses Cardiac: RRR; no murmurs, rubs, or gallops,no edema  Respiratory:  CTAB bilaterally, normal work of breathing GI: soft, nontender, nondistended, + BS Extremities: No LE edema Skin: warm and dry, no rash Neuro:  Strength and sensation are intact  EKG: NSR  Recent Labs: Reviewed  Studies: Reviewed  ASSESSMENT AND PLAN: Jesse Mckinney is a 38 y.o. male who presents for new visit.  - He is presenting with palpitation for 3 months and dyspnea for about a week.  He was supposed to be on made-up XL 25 that was recently uptitrated to 50 XL, but he has actually been taking 12.5 and recently increased to 25 XL daily.  He reports some improvement in palpitations with the metoprolol .  We will obtain a 14-day Zio patch and and remind him to take XL at the 50 mg dose. - Regarding his dyspnea, I suspect that this is in the setting of recent weight gain or undiagnosed sleep apnea.  He is supposed to get a sleep study done.  Will obtain a 2D echocardiogram for further evaluation.   Signed, Joelle VEAR Ren Donley, MD  07/06/2024 12:34 PM    [facility]

## 2024-07-06 NOTE — Progress Notes (Unsigned)
 ZIO [id number] applied.

## 2024-07-06 NOTE — Telephone Encounter (Signed)
 Patient c/o Palpitations: STAT if patient c/o lightheadedness, shortness of breath, or chest pain   How long have you had palpitations/irregular HR/ Afib? Are you having the symptoms now? Started in May   Are you currently experiencing lightheadedness, SOB or CP? SOB   Do you have a history of afib (atrial fibrillation) or irregular heart rhythm? No   Have you checked your BP or HR? (document readings if available):  125/77 84  Are you experiencing any other symptoms? no   Transferred to Triage.

## 2024-07-06 NOTE — Patient Instructions (Addendum)
 Medication Instructions:  Increase Metoprolol  Succinate 50 mg take one tablet daily *If you need a refill on your cardiac medications before your next appointment, please call your pharmacy*   Testing/Procedures: Your physician has requested that you have an echocardiogram. Echocardiography is a painless test that uses sound waves to create images of your heart. It provides your doctor with information about the size and shape of your heart and how well your heart's chambers and valves are working. This procedure takes approximately one hour. There are no restrictions for this procedure. Please do NOT wear cologne, perfume, aftershave, or lotions (deodorant is allowed). Please arrive 15 minutes prior to your appointment time.  Please note: We ask at that you not bring children with you during ultrasound (echo/ vascular) testing. Due to room size and safety concerns, children are not allowed in the ultrasound rooms during exams. Our front office staff cannot provide observation of children in our lobby area while testing is being conducted. An adult accompanying a patient to their appointment will only be allowed in the ultrasound room at the discretion of the ultrasound technician under special circumstances. We apologize for any inconvenience.      ZIO XT- Long Term Monitor Instructions  Your physician has requested you wear a ZIO patch monitor for 14 days.  This is a single patch monitor. [employer] supplies one patch monitor per enrollment. Additional stickers are not available. Please do not apply patch if you will be having a Nuclear Stress Test,  Echocardiogram, Cardiac CT, MRI, or Chest Xray during the period you would be wearing the  monitor. The patch cannot be worn during these tests. You cannot remove and re-apply the  ZIO XT patch monitor.  Billing and Patient Assistance Program Information  We have supplied [employer] with any of your insurance information on file for billing  purposes. [employer] offers a sliding scale Patient Assistance Program for patients that do not have  insurance, or whose insurance does not completely cover the cost of the ZIO monitor.  You must apply for the Patient Assistance Program to qualify for this discounted rate.  To apply, please call [employer] at [phone], select option 4, select option 2, ask to apply for  Patient Assistance Program. Meredeth will ask your household income, and how many people  are in your household. They will quote your out-of-pocket cost based on that information.  [employer] will also be able to set up a 60-month, interest-free payment plan if needed.   When you are ready to remove the patch, follow instructions on the last 2 pages of Patient  Logbook. Stick patch monitor onto the last page of Patient Logbook.  Place Patient Logbook in the blue and white box. Use locking tab on box and tape box closed  securely. The blue and white box has prepaid postage on it. Please place it in the mailbox as  soon as possible. Your physician should have your test results approximately 7 days after the  monitor has been mailed back to [facility].  Call [facility] Customer Care at [phone] if you have questions regarding  your ZIO XT patch monitor. Call them immediately if you see an orange light blinking on your  monitor.  If your monitor falls off in less than 4 days, contact our Monitor department at [phone].  If your monitor becomes loose or falls off after 4 days call [employer] at [phone] for  suggestions on securing your monitor   Follow-Up: At [facility], you and your  health needs are our priority.  As part of our continuing mission to provide you with exceptional heart care, our providers are all part of one team.  This team includes your primary Cardiologist (physician) and Advanced Practice Providers or APPs (Physician Assistants and Nurse Practitioners) who all work together to provide  you with the care you need, when you need it.  Your next appointment:   1 month(s)  Provider:   One of our Advanced Practice Providers (APPs): Morse Clause, PA-C  Lamarr Satterfield, NP Miriam Shams, NP  Olivia Pavy, PA-C Josefa Beauvais, NP  Leontine Salen, PA-C Orren Fabry, PA-C  Hao Meng, PA-C Ernest Dick, NP  Damien Braver, NP Jon Hails, PA-C  Waddell Donath, PA-C    Dayna Dunn, PA-C  Scott Weaver, PA-C Lum Louis, NP Katlyn West, NP Callie Goodrich, PA-C  Xika Zhao, NP Sheng Haley, PA-C    Kathleen Johnson, PA-C

## 2024-07-06 NOTE — Telephone Encounter (Signed)
 Spoke with patient and he reports that he has been having heart palpitations since May that has been progressing with shortness of breat. Pt admits that shortness of breath comes and goes, but palpitations are constant. Pt reports that he was started on metoprolol , but it has not given much relief. ER precautions given. Appt made with Dr. Azobou for 07/06/24 @ 11:00 AM

## 2024-08-09 ENCOUNTER — Ambulatory Visit: Payer: Self-pay

## 2024-08-09 DIAGNOSIS — R002 Palpitations: Secondary | ICD-10-CM

## 2024-08-18 ENCOUNTER — Ambulatory Visit (HOSPITAL_COMMUNITY): Attending: Cardiology

## 2024-08-18 ENCOUNTER — Encounter (HOSPITAL_COMMUNITY): Payer: Self-pay

## 2024-08-21 NOTE — Progress Notes (Unsigned)
 No show

## 2024-08-26 ENCOUNTER — Ambulatory Visit: Admitting: Emergency Medicine
